# Patient Record
Sex: Male | Born: 1990 | Race: Black or African American | Hispanic: No | Marital: Single | Smoking: Never smoker
Health system: Southern US, Community
[De-identification: ages and names within clinical notes are randomized; demographics above are authoritative.]

## PROBLEM LIST (undated history)

## (undated) ENCOUNTER — Emergency Department (HOSPITAL_COMMUNITY): Admission: EM | Discharge status: Against Medical Advice | Payer: Self-pay

## (undated) DIAGNOSIS — N400 Enlarged prostate without lower urinary tract symptoms: Secondary | ICD-10-CM

---

## 2013-07-24 ENCOUNTER — Emergency Department (HOSPITAL_COMMUNITY): Admission: EM | Admit: 2013-07-24 | Discharge: 2013-07-24 | Discharge status: Against Medical Advice | Payer: Self-pay

## 2013-07-24 NOTE — ED Notes (Signed)
Have called pt x 3, no response in lobby

## 2013-08-14 ENCOUNTER — Emergency Department (HOSPITAL_COMMUNITY): Payer: BC Managed Care – PPO

## 2013-08-14 ENCOUNTER — Emergency Department (HOSPITAL_COMMUNITY)
Admission: EM | Admit: 2013-08-14 | Discharge: 2013-08-14 | Disposition: A | Discharge status: Home / Self Care | Payer: BC Managed Care – PPO | Attending: Emergency Medicine | Admitting: Emergency Medicine

## 2013-08-14 ENCOUNTER — Encounter (HOSPITAL_COMMUNITY): Payer: Self-pay | Admitting: Emergency Medicine

## 2013-08-14 DIAGNOSIS — S6990XA Unspecified injury of unspecified wrist, hand and finger(s), initial encounter: Principal | ICD-10-CM

## 2013-08-14 DIAGNOSIS — M25521 Pain in right elbow: Secondary | ICD-10-CM

## 2013-08-14 DIAGNOSIS — S59909A Unspecified injury of unspecified elbow, initial encounter: Secondary | ICD-10-CM | POA: Insufficient documentation

## 2013-08-14 DIAGNOSIS — S59919A Unspecified injury of unspecified forearm, initial encounter: Principal | ICD-10-CM

## 2013-08-14 DIAGNOSIS — Y939 Activity, unspecified: Secondary | ICD-10-CM | POA: Insufficient documentation

## 2013-08-14 MED ORDER — NAPROXEN 250 MG PO TABS
500.0000 mg | ORAL_TABLET | Freq: Once | ORAL | Status: AC
  Administered 2013-08-14: 500 mg via ORAL
  Filled 2013-08-14: qty 2

## 2013-08-14 MED ORDER — NAPROXEN 500 MG PO TABS: 500.0000 mg | ORAL_TABLET | Freq: Two times a day (BID) | ORAL | Status: DC
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
## 2013-08-14 NOTE — Discharge Instructions (Signed)
Musculoskeletal Pain Musculoskeletal pain is muscle and boney aches and pains. These pains can occur in any part of the body. Your caregiver may treat you without knowing the cause of the pain. They may treat you if blood or urine tests, X-rays, and other tests were normal.  CAUSES There is often not a definite cause or reason for these pains. These pains may be caused by a type of germ (virus). The discomfort may also come from overuse. Overuse includes working out too hard when your body is not fit. Boney aches also come from weather changes. Bone is sensitive to atmospheric pressure changes. HOME CARE INSTRUCTIONS   Ask when your test results will be ready. Make sure you get your test results.  Only take over-the-counter or prescription medicines for pain, discomfort, or fever as directed by your caregiver. If you were given medications for your condition, do not drive, operate machinery or power tools, or sign legal documents for 24 hours. Do not drink alcohol. Do not take sleeping pills or other medications that may interfere with treatment.  Continue all activities unless the activities cause more pain. When the pain lessens, slowly resume normal activities. Gradually increase the intensity and duration of the activities or exercise.  During periods of severe pain, bed rest may be helpful. Lay or sit in any position that is comfortable.  Putting ice on the injured area.  Put ice in a bag.  Place a towel between your skin and the bag.  Leave the ice on for 15 to 20 minutes, 3 to 4 times a day.  Follow up with your caregiver for continued problems and no reason can be found for the pain. If the pain becomes worse or does not go away, it may be necessary to repeat tests or do additional testing. Your caregiver may need to look further for a possible cause. SEEK IMMEDIATE MEDICAL CARE IF:  You have pain that is getting worse and is not relieved by medications.  You develop chest pain  that is associated with shortness or breath, sweating, feeling sick to your stomach (nauseous), or throw up (vomit).  Your pain becomes localized to the abdomen.  You develop any new symptoms that seem different or that concern you. MAKE SURE YOU:   Understand these instructions.  Will watch your condition.  Will get help right away if you are not doing well or get worse. Document Released: 04/05/2005 Document Revised: 06/28/2011 Document Reviewed: 12/08/2012 [facility] Patient Information 2014 [place], [place].  Motor Vehicle Collision After a car crash (motor vehicle collision), it is normal to have bruises and sore muscles. The first 24 hours usually feel the worst. After that, you will likely start to feel better each day. HOME CARE  Put ice on the injured area.  Put ice in a plastic bag.  Place a towel between your skin and the bag.  Leave the ice on for 15-20 minutes, 03-04 times a day.  Drink enough fluids to keep your pee (urine) clear or pale yellow.  Do not drink alcohol.  Take a warm shower or bath 1 or 2 times a day. This helps your sore muscles.  Return to activities as told by your doctor. Be careful when lifting. Lifting can make neck or back pain worse.  Only take medicine as told by your doctor. Do not use aspirin. GET HELP RIGHT AWAY IF:   Your arms or legs tingle, feel weak, or lose feeling (numbness).  You have headaches that do not  get better with medicine.  You have neck pain, especially in the middle of the back of your neck.  You cannot control when you pee (urinate) or poop (bowel movement).  Pain is getting worse in any part of your body.  You are short of breath, dizzy, or pass out (faint).  You have chest pain.  You feel sick to your stomach (nauseous), throw up (vomit), or sweat.  You have belly (abdominal) pain that gets worse.  There is blood in your pee, poop, or throw up.  You have pain in your shoulder (shoulder strap  areas).  Your problems are getting worse. MAKE SURE YOU:   Understand these instructions.  Will watch your condition.  Will get help right away if you are not doing well or get worse. Document Released: 09/22/2007 Document Revised: 06/28/2011 Document Reviewed: 09/02/2010 [facility] Patient Information 2014 [place], [place].
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
## 2013-08-14 NOTE — ED Notes (Signed)
Patient transported to X-ray 

## 2013-08-15 NOTE — ED Provider Notes (Signed)
Medical screening examination/treatment/procedure(s) were performed by non-physician practitioner and as supervising physician I was immediately available for consultation/collaboration.  Deana Krock T Celia Friedland, MD 08/15/13 2254 

## 2013-09-09 ENCOUNTER — Encounter (HOSPITAL_COMMUNITY): Payer: Self-pay | Admitting: Emergency Medicine

## 2013-09-09 ENCOUNTER — Emergency Department (HOSPITAL_COMMUNITY)
Admission: EM | Admit: 2013-09-09 | Discharge: 2013-09-09 | Disposition: A | Discharge status: Home / Self Care | Payer: BC Managed Care – PPO | Attending: Emergency Medicine | Admitting: Emergency Medicine

## 2013-09-09 DIAGNOSIS — M62830 Muscle spasm of back: Secondary | ICD-10-CM

## 2013-09-09 DIAGNOSIS — Z791 Long term (current) use of non-steroidal anti-inflammatories (NSAID): Secondary | ICD-10-CM | POA: Insufficient documentation

## 2013-09-09 DIAGNOSIS — M5380 Other specified dorsopathies, site unspecified: Secondary | ICD-10-CM | POA: Insufficient documentation

## 2013-09-09 DIAGNOSIS — Z87448 Personal history of other diseases of urinary system: Secondary | ICD-10-CM | POA: Insufficient documentation

## 2013-09-09 HISTORY — DX: Benign prostatic hyperplasia without lower urinary tract symptoms: N40.0

## 2013-09-09 LAB — URINALYSIS, ROUTINE W REFLEX MICROSCOPIC
BILIRUBIN URINE: NEGATIVE
GLUCOSE, UA: NEGATIVE mg/dL
HGB URINE DIPSTICK: NEGATIVE
KETONES UR: NEGATIVE mg/dL
Leukocytes, UA: NEGATIVE
Nitrite: NEGATIVE
PROTEIN: NEGATIVE mg/dL
Specific Gravity, Urine: 1.029 (ref 1.005–1.030)
UROBILINOGEN UA: 1 mg/dL (ref 0.0–1.0)
pH: 6 (ref 5.0–8.0)

## 2013-09-09 MED ORDER — HYDROCODONE-ACETAMINOPHEN 5-325 MG PO TABS: 1.0000 | ORAL_TABLET | Freq: Four times a day (QID) | ORAL | Status: DC | PRN

## 2013-09-09 MED ORDER — OXYCODONE-ACETAMINOPHEN 5-325 MG PO TABS
2.0000 | ORAL_TABLET | Freq: Once | ORAL | Status: AC
  Administered 2013-09-09: 2 via ORAL
  Filled 2013-09-09: qty 2

## 2013-09-09 MED ORDER — ONDANSETRON 4 MG PO TBDP
4.0000 mg | ORAL_TABLET | Freq: Once | ORAL | Status: AC
  Administered 2013-09-09: 4 mg via ORAL
  Filled 2013-09-09: qty 1

## 2013-09-09 NOTE — ED Notes (Signed)
Reports pain to L side of back that started after using bathroom at work.  No known injury.  Denies pain with urination.  States pain is worse with movement.

## 2013-09-09 NOTE — ED Provider Notes (Signed)
CSN: [id number]     Arrival date & time 09/09/13  2028 History   First MD Initiated Contact with Patient 09/09/13 2043    This chart was scribed for non-physician practitioner, Arthor Captain, working with Rolan Bucco, MD by Marica Otter, ED Scribe. This patient was seen in room TR04C/TR04C and the patient's care was started at  10:22 PM.  Chief Complaint  Patient presents with  . Back Pain   The history is provided by the patient. No language interpreter was used.   HPI Comments: Marcus Jefferson is a 23 y.o. male, with a history of prostate enlargement, who presents to the Emergency Department complaining of left sided back pain onset couple of days ago after pt used the bathroom at work where he was "straining" to have a bowel movement. Thereafter, pt reports that he tried to stretch his back and had a muscle spasm. Pt reports the pain has alleviated since its onset and reports that initially his back hurt with inhalation. Pt reports, however, that his pain continues to be worse with movement. Pt denies any new injuries to the area. Pt reports that he has back pain at baseline. Pt denies radiating pain, loss of bowel or bladder function, abd pain. Pt reports he has to lift boxes as part of his job.   Past Medical History  Diagnosis Date  . Prostate enlargement    History reviewed. No pertinent past surgical history. No family history on file. History  Substance Use Topics  . Smoking status: Never Smoker   . Smokeless tobacco: Not on file  . Alcohol Use: No    Review of Systems  Constitutional: Negative for fever.  Musculoskeletal: Positive for back pain (left sided back pain ).   Allergies  Review of patient's allergies indicates no known allergies.  Home Medications   Prior to Admission medications   Medication Sig Start Date End Date Taking? Authorizing Provider  naproxen (NAPROSYN) 500 MG tablet Take 1 tablet (500 mg total) by mouth 2 (two) times daily with a meal. 08/14/13    Mora Bellman, PA-C   Triage Vitals: BP 132/71  Pulse 64  Temp(Src) 98.3 F (36.8 C) (Oral)  Resp 16  Ht 6\' 1"  (1.854 m)  Wt 201 lb 9.6 oz (91.445 kg)  BMI 26.60 kg/m2  SpO2 95% Physical Exam  Nursing note and vitals reviewed. Constitutional: He is oriented to person, place, and time. He appears well-developed and well-nourished. No distress.  HENT:  Head: Normocephalic and atraumatic.  Eyes: EOM are normal.  Neck: Neck supple. No tracheal deviation present.  Cardiovascular: Normal rate.   Pulmonary/Chest: Effort normal. No respiratory distress.  Musculoskeletal: Normal range of motion. He exhibits tenderness (clear spasm of lower lumbar para spinal muscle. Hurts to twist to the left, extension and left lateral flexion. ).  Neurological: He is alert and oriented to person, place, and time.  Skin: Skin is warm and dry.  Psychiatric: He has a normal mood and affect. His behavior is normal.    ED Course  Procedures (including critical care time) DIAGNOSTIC STUDIES: Oxygen Saturation is 95% on RA, adequate by my interpretation.    COORDINATION OF CARE: 10:30 PM-Discussed treatment plan which includes meds, gentle stretching, massage therapy, and urology referral with pt at bedside and pt agreed to plan.   Labs Review Labs Reviewed  URINALYSIS, ROUTINE W REFLEX MICROSCOPIC    Imaging Review No results found.   EKG Interpretation None      MDM  Final diagnoses:  Spasm of back muscles    Patient with back pain.  No neurological deficits and normal neuro exam.  Patient can walk but states is painful.  No loss of bowel or bladder control.  No concern for cauda equina.  No fever, night sweats, weight loss, h/o cancer, IVDU.  RICE protocol and pain medicine indicated and discussed with patient.    I personally performed the services described in this documentation, which was scribed in my presence. The recorded information has been reviewed and is accurate.       Arthor CaptainAbigail Ludivina Guymon, PA-C 09/10/13 2053

## 2013-09-09 NOTE — Discharge Instructions (Signed)
Musculoskeletal Pain Musculoskeletal pain is muscle and boney aches and pains. These pains can occur in any part of the body. Your caregiver may treat you without knowing the cause of the pain. They may treat you if blood or urine tests, X-rays, and other tests were normal.  CAUSES There is often not a definite cause or reason for these pains. These pains may be caused by a type of germ (virus). The discomfort may also come from overuse. Overuse includes working out too hard when your body is not fit. Boney aches also come from weather changes. Bone is sensitive to atmospheric pressure changes. HOME CARE INSTRUCTIONS   Ask when your test results will be ready. Make sure you get your test results.  Only take over-the-counter or prescription medicines for pain, discomfort, or fever as directed by your caregiver. If you were given medications for your condition, do not drive, operate machinery or power tools, or sign legal documents for 24 hours. Do not drink alcohol. Do not take sleeping pills or other medications that may interfere with treatment.  Continue all activities unless the activities cause more pain. When the pain lessens, slowly resume normal activities. Gradually increase the intensity and duration of the activities or exercise.  During periods of severe pain, bed rest may be helpful. Lay or sit in any position that is comfortable.  Putting ice on the injured area.  Put ice in a bag.  Place a towel between your skin and the bag.  Leave the ice on for 15 to 20 minutes, 3 to 4 times a day.  Follow up with your caregiver for continued problems and no reason can be found for the pain. If the pain becomes worse or does not go away, it may be necessary to repeat tests or do additional testing. Your caregiver may need to look further for a possible cause. SEEK IMMEDIATE MEDICAL CARE IF:  You have pain that is getting worse and is not relieved by medications.  You develop chest pain  that is associated with shortness or breath, sweating, feeling sick to your stomach (nauseous), or throw up (vomit).  Your pain becomes localized to the abdomen.  You develop any new symptoms that seem different or that concern you. MAKE SURE YOU:   Understand these instructions.  Will watch your condition.  Will get help right away if you are not doing well or get worse. Document Released: 04/05/2005 Document Revised: 06/28/2011 Document Reviewed: 12/08/2012 [facility] Patient Information 2014 [place], [place].  Spasticity Spasticity is a condition in which certain muscles contract continuously. This causes stiffness or tightness of the muscles. It may interfere with movement, speech, and manner of walking. CAUSES  This condition is usually caused by damage to the portion of the brain or spinal cord that controls voluntary movement. It may occur in association with:  Spinal cord injury.  Multiple sclerosis.  Cerebral palsy.  Brain damage due to lack of oxygen.  Brain trauma.  Severe head injury.  Metabolic diseases such as:  Adrenoleukodystrophy.  ALS Truddie Hidden(Lou Gehrig's disease).  Phenylketonuria. SYMPTOMS   Increased muscle tone (hypertonicity).  A series of rapid muscle contractions (clonus).  Exaggerated deep tendon reflexes.  Muscle spasms.  Involuntary crossing of the legs (scissoring).  Fixed joints. The degree of spasticity varies. It ranges from mild muscle stiffness to severe, painful, and uncontrollable muscle spasms. It can interfere with rehabilitation in patients with certain disorders. It often interferes with daily activities. TREATMENT  Treatment may include:  Medications.  Physical therapy regimens. They may include muscle stretching and range of motion exercises. These help prevent shrinkage or shortening of muscles. They also help reduce the severity of symptoms.  Surgery. This may be recommended for tendon release or to sever the nerve-muscle  pathway. PROGNOSIS  The outcome for those with spasticity depends on:  Severity of the spasticity.  Associated disorder(s). Document Released: 03/26/2002 Document Revised: 06/28/2011 Document Reviewed: 04/05/2005 [facility] Patient Information 2014 [place], [place].

## 2013-09-12 ENCOUNTER — Emergency Department (HOSPITAL_COMMUNITY)
Admission: EM | Admit: 2013-09-12 | Discharge: 2013-09-12 | Disposition: A | Discharge status: Home / Self Care | Payer: BC Managed Care – PPO | Attending: Emergency Medicine | Admitting: Emergency Medicine

## 2013-09-12 ENCOUNTER — Encounter (HOSPITAL_COMMUNITY): Payer: Self-pay | Admitting: Emergency Medicine

## 2013-09-12 DIAGNOSIS — M549 Dorsalgia, unspecified: Secondary | ICD-10-CM

## 2013-09-12 DIAGNOSIS — Z791 Long term (current) use of non-steroidal anti-inflammatories (NSAID): Secondary | ICD-10-CM | POA: Insufficient documentation

## 2013-09-12 DIAGNOSIS — M545 Low back pain, unspecified: Secondary | ICD-10-CM | POA: Insufficient documentation

## 2013-09-12 DIAGNOSIS — Z87448 Personal history of other diseases of urinary system: Secondary | ICD-10-CM | POA: Insufficient documentation

## 2013-09-12 DIAGNOSIS — K5900 Constipation, unspecified: Secondary | ICD-10-CM | POA: Insufficient documentation

## 2013-09-12 MED ORDER — PREDNISONE 20 MG PO TABS
40.0000 mg | ORAL_TABLET | Freq: Once | ORAL | Status: AC
  Administered 2013-09-12: 40 mg via ORAL
  Filled 2013-09-12: qty 2

## 2013-09-12 MED ORDER — KETOROLAC TROMETHAMINE 30 MG/ML IJ SOLN
30.0000 mg | Freq: Once | INTRAMUSCULAR | Status: DC
  Filled 2013-09-12: qty 1

## 2013-09-12 MED ORDER — PREDNISONE 20 MG PO TABS: 40.0000 mg | ORAL_TABLET | Freq: Every day | ORAL | Status: DC

## 2013-09-12 MED ORDER — METHOCARBAMOL 500 MG PO TABS: 1000.0000 mg | ORAL_TABLET | Freq: Four times a day (QID) | ORAL | Status: DC | PRN

## 2013-09-12 MED ORDER — METHOCARBAMOL 500 MG PO TABS
1000.0000 mg | ORAL_TABLET | Freq: Once | ORAL | Status: AC
  Administered 2013-09-12: 1000 mg via ORAL
  Filled 2013-09-12: qty 2

## 2013-09-12 NOTE — Discharge Instructions (Signed)
For pain control you may take up to 800mg  of Motrin (also known as ibuprofen). That is usually 4 over the counter pills,  3 times a day. Take with food to minimize stomach irritation   You can also take  tylenol (acetaminophen) 975mg  (this is 3 over the counter pills) four times a day. Do not drink alcohol or combine with other medications that have acetaminophen as an ingredient (Read the labels!).    For breakthrough pain you may take Robaxin. Do not drink alcohol, drive or operate heavy machinery when taking Robaxin.  Do not hesitate to return to the Emergency Department for any new, worsening or concerning symptoms.   If you do not have a primary care doctor you can establish one at the   [facility]: [address] [place] [address] [phone]  After you establish care. Let them know you were seen in the emergency room. They must obtain records for further management.    Back Pain, Adult Back pain is very common. The pain often gets better over time. The cause of back pain is usually not dangerous. Most people can learn to manage their back pain on their own.  HOME CARE   Stay active. Start with short walks on flat ground if you can. Try to walk farther each day.  Do not sit, drive, or stand in one place for more than 30 minutes. Do not stay in bed.  Do not avoid exercise or work. Activity can help your back heal faster.  Be careful when you bend or lift an object. Bend at your knees, keep the object close to you, and do not twist.  Sleep on a firm mattress. Lie on your side, and bend your knees. If you lie on your back, put a pillow under your knees.  Only take medicines as told by your doctor.  Put ice on the injured area.  Put ice in a plastic bag.  Place a towel between your skin and the bag.  Leave the ice on for 15-20 minutes, 03-04 times a day for the first 2 to 3 days. After that, you can switch between ice and heat packs.  Ask your doctor  about back exercises or massage.  Avoid feeling anxious or stressed. Find good ways to deal with stress, such as exercise. GET HELP RIGHT AWAY IF:   Your pain does not go away with rest or medicine.  Your pain does not go away in 1 week.  You have new problems.  You do not feel well.  The pain spreads into your legs.  You cannot control when you poop (bowel movement) or pee (urinate).  Your arms or legs feel weak or lose feeling (numbness).  You feel sick to your stomach (nauseous) or throw up (vomit).  You have belly (abdominal) pain.  You feel like you may pass out (faint). MAKE SURE YOU:   Understand these instructions.  Will watch your condition.  Will get help right away if you are not doing well or get worse. Document Released: 09/22/2007 Document Revised: 06/28/2011 Document Reviewed: 08/24/2010 [facility] Patient Information 2014 [place], [place].

## 2013-09-12 NOTE — ED Notes (Addendum)
Pt c/o left lower back pain that started while he was at work after he picked up a heavy box, was sent over here and told he pulled a muscle. Pt sts the pain hasn't gone away and the pain increases with movement. sts he has been taking medicine at home and trying to stretch some at home. Denies urinary/bowel incontinence. Nad, skin warm and dry, resp e/u.

## 2013-09-12 NOTE — ED Provider Notes (Signed)
CSN: [id number]     Arrival date & time 09/12/13  1901 History  This chart was scribed for non-physician practitioner Wynetta Emery, PA-C working with Layla Maw Ward, DO by Danella Maiers, ED Scribe. This patient was seen in room TR08C/TR08C and the patient's care was started at 7:16 PM.    Chief Complaint  Patient presents with  . Back Pain   The history is provided by the patient. No language interpreter was used.   HPI Comments: Marcus Jefferson is a 23 y.o. male who presents to the Emergency Department complaining of left lower back pain onset 5 days ago while lifting a heavy box at work. He was seen here 3 days ago for the same and was given percocet with no relief. He reports recent constipation.  He states he has been told he has an enlarged prostate. He has an appointment with urology. He had a normal colonoscopy and endoscopy after hematuria and blood in stool, which has resolved.  Denies fever, chills, change in  bladder habits, h/o IDVU or cancer, numbness or weakness.   Past Medical History  Diagnosis Date  . Prostate enlargement    History reviewed. No pertinent past surgical history. No family history on file. History  Substance Use Topics  . Smoking status: Never Smoker   . Smokeless tobacco: Not on file  . Alcohol Use: No    Review of Systems  Constitutional: Negative for fever.  Musculoskeletal: Positive for back pain.  Neurological: Negative for numbness.  All other systems reviewed and are negative.     Allergies  Review of patient's allergies indicates no known allergies.  Home Medications   Prior to Admission medications   Medication Sig Start Date End Date Taking? Authorizing Provider  HYDROcodone-acetaminophen (NORCO) 5-325 MG per tablet Take 1-2 tablets by mouth every 6 (six) hours as needed for moderate pain. 09/09/13   Arthor Captain, PA-C  naproxen (NAPROSYN) 500 MG tablet Take 1 tablet (500 mg total) by mouth 2 (two) times daily with a meal. 08/14/13    Mora Bellman, PA-C   BP 127/65  Pulse 74  Temp(Src) 98.2 F (36.8 C) (Oral)  Resp 16  Ht 6' (1.829 m)  Wt 196 lb 12.8 oz (89.268 kg)  BMI 26.69 kg/m2  SpO2 98% Physical Exam  Nursing note and vitals reviewed. Constitutional: He is oriented to person, place, and time. He appears well-developed and well-nourished. No distress.  HENT:  Head: Normocephalic.  Eyes: Conjunctivae and EOM are normal.  Cardiovascular: Normal rate.   Pulmonary/Chest: Effort normal and breath sounds normal. No stridor.  Abdominal: Soft.  Musculoskeletal: Normal range of motion.  No point tenderness to percussion of lumbar spinal processes.  Patient with left lumbar paraspinal spasm and tenderness to palpation Strength is 5 out of 5 to bilateral lower extremities at hip and knee; extensor hallucis longus 5 out of 5. Ankle strength 5 out of 5, no clonus, neurovascularly intact. No saddle anaesthesia. Patellar reflexes are 2+ bilaterally.     Strait leg raise negative bilaterally Ambulates with nonantalgic gait   Neurological: He is alert and oriented to person, place, and time.  Psychiatric: He has a normal mood and affect.    ED Course  Procedures (including critical care time) Medications  predniSONE (DELTASONE) tablet 40 mg (40 mg Oral Given 09/12/13 2102)  methocarbamol (ROBAXIN) tablet 1,000 mg (1,000 mg Oral Given 09/12/13 2102)    DIAGNOSTIC STUDIES: Oxygen Saturation is 98% on RA, normal by my interpretation.  COORDINATION OF CARE: 8:16 PM- Discussed treatment plan with pt which includes Toradol injection and discharge home with prednisone and robaxin. Will refer to ortho. Pt agrees to plan.    Labs Review Labs Reviewed - No data to display  Imaging Review No results found.   EKG Interpretation None      MDM   Final diagnoses:  Back pain    Filed Vitals:   09/12/13 1909 09/12/13 2100  BP: 127/65 115/60  Pulse: 74 57  Temp: 98.2 F (36.8 C) 98 F (36.7 C)  TempSrc:  Oral Oral  Resp: 16 20  Height: 6' (1.829 m)   Weight: 196 lb 12.8 oz (89.268 kg)   SpO2: 98% 99%    Medications  predniSONE (DELTASONE) tablet 40 mg (40 mg Oral Given 09/12/13 2102)  methocarbamol (ROBAXIN) tablet 1,000 mg (1,000 mg Oral Given 09/12/13 2102)    Marcus Jefferson is a 23 y.o. male presenting with  back pain.  Constipation likely secondary to Percocet use. No neurological deficits and normal neuro exam.  Patient can walk but states is painful.  No loss of bowel or bladder control.  No concern for cauda equina.  No fever, night sweats, weight loss, h/o cancer, IVDU.  RICE protocol and pain medicine indicated and discussed with patient.   Evaluation does not show pathology that would require ongoing emergent intervention or inpatient treatment. Pt is hemodynamically stable and mentating appropriately. Discussed findings and plan with patient/guardian, who agrees with care plan. All questions answered. Return precautions discussed and outpatient follow up given.   Discharge Medication List as of 09/12/2013  8:25 PM    START taking these medications   Details  methocarbamol (ROBAXIN) 500 MG tablet Take 2 tablets (1,000 mg total) by mouth 4 (four) times daily as needed (Pain)., Starting 09/12/2013, Until Discontinued, Print    predniSONE (DELTASONE) 20 MG tablet Take 2 tablets (40 mg total) by mouth daily., Starting 09/12/2013, Until Discontinued, Print        Note: Portions of this report may have been transcribed using voice recognition software. Every effort was made to ensure accuracy; however, inadvertent computerized transcription errors may be present   I personally performed the services described in this documentation, which was scribed in my presence. The recorded information has been reviewed and is accurate.   Joni Reiningicole Burney Calzadilla, PA-C 09/13/13 [phone]

## 2013-09-13 NOTE — ED Provider Notes (Signed)
Medical screening examination/treatment/procedure(s) were performed by non-physician practitioner and as supervising physician I was immediately available for consultation/collaboration.   EKG Interpretation None        Jaiyanna Safran, MD 09/13/13 1055 

## 2013-09-15 NOTE — ED Provider Notes (Signed)
Medical screening examination/treatment/procedure(s) were performed by non-physician practitioner and as supervising physician I was immediately available for consultation/collaboration.   EKG Interpretation None        Elky Funches N Jaedin Trumbo, DO 09/15/13 0707 

## 2013-12-25 ENCOUNTER — Emergency Department (HOSPITAL_COMMUNITY)
Admission: EM | Admit: 2013-12-25 | Discharge: 2013-12-25 | Discharge status: Against Medical Advice | Payer: No Typology Code available for payment source | Attending: Emergency Medicine | Admitting: Emergency Medicine

## 2013-12-25 ENCOUNTER — Encounter (HOSPITAL_COMMUNITY): Payer: Self-pay | Admitting: Emergency Medicine

## 2013-12-25 DIAGNOSIS — N509 Disorder of male genital organs, unspecified: Secondary | ICD-10-CM | POA: Insufficient documentation

## 2013-12-25 NOTE — ED Notes (Signed)
Pt reports testicle pain and rectal pain x "years". States he has been seen for same multiple times and this pain is the same. Denies swelling to testicle. 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 AO x4.

## 2013-12-25 NOTE — ED Notes (Addendum)
I spoke to Marcus Jefferson and he said he had to go get his daughter and son.  He left without being seen after triage.

## 2014-02-11 ENCOUNTER — Emergency Department (HOSPITAL_COMMUNITY): Payer: BC Managed Care – PPO

## 2014-02-11 ENCOUNTER — Emergency Department (HOSPITAL_COMMUNITY)
Admission: EM | Admit: 2014-02-11 | Discharge: 2014-02-11 | Disposition: A | Discharge status: Home / Self Care | Payer: BC Managed Care – PPO | Attending: Emergency Medicine | Admitting: Emergency Medicine

## 2014-02-11 ENCOUNTER — Encounter (HOSPITAL_COMMUNITY): Payer: Self-pay | Admitting: Emergency Medicine

## 2014-02-11 DIAGNOSIS — Z87448 Personal history of other diseases of urinary system: Secondary | ICD-10-CM | POA: Insufficient documentation

## 2014-02-11 DIAGNOSIS — N508 Other specified disorders of male genital organs: Secondary | ICD-10-CM | POA: Insufficient documentation

## 2014-02-11 DIAGNOSIS — Z791 Long term (current) use of non-steroidal anti-inflammatories (NSAID): Secondary | ICD-10-CM | POA: Insufficient documentation

## 2014-02-11 DIAGNOSIS — N509 Disorder of male genital organs, unspecified: Secondary | ICD-10-CM

## 2014-02-11 DIAGNOSIS — R52 Pain, unspecified: Secondary | ICD-10-CM

## 2014-02-11 DIAGNOSIS — N50819 Testicular pain, unspecified: Secondary | ICD-10-CM

## 2014-02-11 LAB — URINALYSIS, ROUTINE W REFLEX MICROSCOPIC
Bilirubin Urine: NEGATIVE
GLUCOSE, UA: NEGATIVE mg/dL
HGB URINE DIPSTICK: NEGATIVE
KETONES UR: NEGATIVE mg/dL
LEUKOCYTES UA: NEGATIVE
Nitrite: NEGATIVE
PH: 5 (ref 5.0–8.0)
PROTEIN: NEGATIVE mg/dL
Specific Gravity, Urine: 1.03 (ref 1.005–1.030)
Urobilinogen, UA: 0.2 mg/dL (ref 0.0–1.0)

## 2014-02-11 MED ORDER — HYDROCODONE-ACETAMINOPHEN 5-325 MG PO TABS
2.0000 | ORAL_TABLET | Freq: Once | ORAL | Status: AC
  Administered 2014-02-11: 2 via ORAL
  Filled 2014-02-11: qty 2

## 2014-02-11 NOTE — ED Notes (Signed)
Pt reports having testicular pain; has been going on for years and will have flare ups; was told he has a cyst; wants to know why its flaring up

## 2014-02-11 NOTE — ED Provider Notes (Signed)
CSN: [id number]     Arrival date & time 02/11/14  1046 History   First MD Initiated Contact with Patient 02/11/14 1116     Chief Complaint  Patient presents with  . Testicle Pain     (Consider location/radiation/quality/duration/timing/severity/associated sxs/prior Treatment) HPI Marcus Jefferson is a 23 y.o. male with a history of reported prostate enlargement comes in for evaluation of bilateral testicular pain. Patient states the testicular pain has been constant for the past 3 or 4 years. [place] in [place]  and is getting mixed reports of enlarged prostate and bladder versus not. Reports having had hematuria and hematochezia in the past with subsequent cystoscopy as well as colonoscopy with normal findings. Reports occasional hesitancy. No current dysuria, hematuria, no sexual dysfunction but does report blood in his semen 2 months ago that has since resolved. Reports taking hot baths relieves the pain, but nothing aggravates the pain. Patient reports he is only here to find out if there is new structural change or cysts in his scrotum causing the pain. No fevers, shortness of breath, chest pain, abdominal pain, dysuria.   Past Medical History  Diagnosis Date  . Prostate enlargement    History reviewed. No pertinent past surgical history. History reviewed. No pertinent family history. History  Substance Use Topics  . Smoking status: Never Smoker   . Smokeless tobacco: Not on file  . Alcohol Use: Yes     Comment: occasionally    Review of Systems  Constitutional: Negative for fever.  HENT: Negative for sore throat.   Eyes: Negative for visual disturbance.  Respiratory: Negative for shortness of breath.   Cardiovascular: Negative for chest pain.  Gastrointestinal: Negative for abdominal pain.  Endocrine: Negative for polyuria.  Genitourinary: Positive for testicular pain. Negative for dysuria.  Skin: Negative for rash.  Neurological:  Negative for headaches.      Allergies  Review of patient's allergies indicates no known allergies.  Home Medications   Prior to Admission medications   Medication Sig Start Date End Date Taking? Authorizing Provider  Menthol-Methyl Salicylate (MUSCLE RUB) 10-15 % CREA Apply 1 application topically as needed for muscle pain.   Yes Historical Provider, MD  Multiple Vitamins-Minerals (MULTIVITAMIN PO) Take 1 tablet by mouth daily.   Yes Historical Provider, MD  naproxen sodium (ANAPROX) 220 MG tablet Take 440 mg by mouth 2 (two) times daily as needed (for pain).   Yes Historical Provider, MD   BP 129/64  Pulse 62  Temp(Src) 98.1 F (36.7 C) (Oral)  Resp 18  Ht 6' (1.829 m)  Wt 205 lb (92.987 kg)  BMI 27.80 kg/m2  SpO2 100% Physical Exam  Nursing note and vitals reviewed. Constitutional: He is oriented to person, place, and time. He appears well-developed and well-nourished.  HENT:  Head: Normocephalic and atraumatic.  Mouth/Throat: Oropharynx is clear and moist.  Eyes: Conjunctivae are normal. Pupils are equal, round, and reactive to light. Right eye exhibits no discharge. Left eye exhibits no discharge. No scleral icterus.  Neck: Neck supple.  Cardiovascular: Normal rate, regular rhythm and normal heart sounds.   Pulmonary/Chest: Effort normal and breath sounds normal. No respiratory distress. He has no wheezes. He has no rales.  Abdominal: Soft. There is no tenderness.  Genitourinary: Penis normal. No penile tenderness.  Testicles appear to have equal and normal lie. No erythema, rashes or other lesions. No evidence of infection. No overt masses, cysts or other deformities appreciated  Musculoskeletal: He exhibits no  tenderness.  Neurological: He is alert and oriented to person, place, and time.  Cranial Nerves II-XII grossly intact  Skin: Skin is warm and dry. No rash noted.  Psychiatric: He has a normal mood and affect.    ED Course  Procedures (including critical care  time) Labs Review Labs Reviewed  URINALYSIS, ROUTINE W REFLEX MICROSCOPIC - Abnormal; Notable for the following:    Color, Urine ORANGE (*)    All other components within normal limits    Imaging Review [place] Scrotum  02/11/2014   CLINICAL DATA:  Testicular pain.  EXAM: SCROTAL ULTRASOUND  DOPPLER ULTRASOUND OF THE TESTICLES  TECHNIQUE: Standard color-flow duplex Doppler exam of the testicles performed.  FINDINGS: Right testicle  Measurements: .  No mass or microlithiasis visualized.  Scratched  Measurements: 5.2 x 2.0 x 3.1 cm. No mass or microlithiasis visualized.  Left testicle  Measurements: 4.5 x 2.0 x 3.4 cm. No mass or microlithiasis visualized.  Right epididymis:  Normal in size and appearance.  Left epididymis:  3 mm epididymal cysts.  Hydrocele:  None visualized.  Varicocele:  Small left varicocele.  Pulsed Doppler interrogation of both testes demonstrates low resistance arterial and venous waveforms bilaterally.  IMPRESSION: 1. No evidence of testicular torsion. 2. Small right varicocele.  Complete ultrasound examination of the testicles, epididymis, and other scrotal structures was performed. Color and spectral Doppler ultrasound were also utilized to evaluate blood flow to the testicles.  COMPARISON:  None.   Electronically Signed   By: Maisie Fushomas  Register   On: 02/11/2014 13:33   [place] Art/ven Flow Abd Pelv Doppler  02/11/2014   CLINICAL DATA:  Testicular pain.  EXAM: SCROTAL ULTRASOUND  DOPPLER ULTRASOUND OF THE TESTICLES  TECHNIQUE: Standard color-flow duplex Doppler exam of the testicles performed.  FINDINGS: Right testicle  Measurements: .  No mass or microlithiasis visualized.  Scratched  Measurements: 5.2 x 2.0 x 3.1 cm. No mass or microlithiasis visualized.  Left testicle  Measurements: 4.5 x 2.0 x 3.4 cm. No mass or microlithiasis visualized.  Right epididymis:  Normal in size and appearance.  Left epididymis:  3 mm epididymal cysts.  Hydrocele:  None visualized.  Varicocele:  Small left  varicocele.  Pulsed Doppler interrogation of both testes demonstrates low resistance arterial and venous waveforms bilaterally.  IMPRESSION: 1. No evidence of testicular torsion. 2. Small right varicocele.  Complete ultrasound examination of the testicles, epididymis, and other scrotal structures was performed. Color and spectral Doppler ultrasound were also utilized to evaluate blood flow to the testicles.  COMPARISON:  None.   Electronically Signed   By: Maisie Fushomas  Register   On: 02/11/2014 13:33     EKG Interpretation None      MDM  Vitals stable - WNL -afebrile Pt resting comfortably in ED. PE not concerning for any other acute or emergent pathology.  Imaging--US Scrotum shows small right varicocele without any evidence of testicular torsion. Also a left 3 mm epididymal cyst. Discussed results with pt at bedside. May fu with Urology--referral given Urinalysis noncontributory  Discussed f/u with PCP and return precautions, pt very amenable to plan. Stable, good condition and is appropriate for discharge   Prior to patient discharge, I discussed and reviewed this case with Dr.Linker    Final diagnoses:  Pain  Testicular pain       Sharlene MottsBenjamin W Keshan Reha, PA-C 02/12/14 [phone]

## 2014-02-11 NOTE — ED Notes (Signed)
Pt returned from US

## 2014-02-11 NOTE — Discharge Instructions (Signed)
Your evaluation in the ED today showed no emergent source for your testicular pain. You may follow-up with urology for further evaluation and management of your testicular pain. Return to ED if he experiences fevers, swelling or redness, difficulties urinating or having bowel movements.

## 2014-02-13 NOTE — ED Provider Notes (Signed)
Medical screening examination/treatment/procedure(s) were performed by non-physician practitioner and as supervising physician I was immediately available for consultation/collaboration.   EKG Interpretation None       Ethelda ChickMartha K Linker, MD 02/13/14 [phone]

## 2014-04-12 ENCOUNTER — Emergency Department (HOSPITAL_COMMUNITY)
Admission: EM | Admit: 2014-04-12 | Discharge: 2014-04-12 | Disposition: A | Discharge status: Home / Self Care | Payer: PRIVATE HEALTH INSURANCE | Attending: Emergency Medicine | Admitting: Emergency Medicine

## 2014-04-12 ENCOUNTER — Encounter (HOSPITAL_COMMUNITY): Payer: Self-pay | Admitting: *Deleted

## 2014-04-12 DIAGNOSIS — Z79899 Other long term (current) drug therapy: Secondary | ICD-10-CM | POA: Insufficient documentation

## 2014-04-12 DIAGNOSIS — Z87448 Personal history of other diseases of urinary system: Secondary | ICD-10-CM | POA: Diagnosis not present

## 2014-04-12 DIAGNOSIS — M545 Low back pain, unspecified: Secondary | ICD-10-CM

## 2014-04-12 MED ORDER — IBUPROFEN 800 MG PO TABS
800.0000 mg | ORAL_TABLET | Freq: Once | ORAL | Status: AC
  Administered 2014-04-12: 800 mg via ORAL
  Filled 2014-04-12: qty 1

## 2014-04-12 MED ORDER — CYCLOBENZAPRINE HCL 10 MG PO TABS
5.0000 mg | ORAL_TABLET | Freq: Once | ORAL | Status: AC
  Administered 2014-04-12: 5 mg via ORAL
  Filled 2014-04-12: qty 1

## 2014-04-12 MED ORDER — CYCLOBENZAPRINE HCL 10 MG PO TABS: 5.0000 mg | ORAL_TABLET | Freq: Three times a day (TID) | ORAL | Status: DC | PRN

## 2014-04-12 MED ORDER — IBUPROFEN 800 MG PO TABS: 800.0000 mg | ORAL_TABLET | Freq: Three times a day (TID) | ORAL | Status: DC | PRN

## 2014-04-12 NOTE — Discharge Instructions (Signed)
Back Exercises Take the medication as needed for pain and spasm. Call any of the numbers on the resource guide to get a primary care physician. Back exercises help treat and prevent back injuries. The goal of back exercises is to increase the strength of your abdominal and back muscles and the flexibility of your back. These exercises should be started when you no longer have back pain. Back exercises include:  Pelvic Tilt. Lie on your back with your knees bent. Tilt your pelvis until the lower part of your back is against the floor. Hold this position 5 to 10 sec and repeat 5 to 10 times.  Knee to Chest. Pull first 1 knee up against your chest and hold for 20 to 30 seconds, repeat this with the other knee, and then both knees. This may be done with the other leg straight or bent, whichever feels better.  Sit-Ups or Curl-Ups. Bend your knees 90 degrees. Start with tilting your pelvis, and do a partial, slow sit-up, lifting your trunk only 30 to 45 degrees off the floor. Take at least 2 to 3 seconds for each sit-up. Do not do sit-ups with your knees out straight. If partial sit-ups are difficult, simply do the above but with only tightening your abdominal muscles and holding it as directed.  Hip-Lift. Lie on your back with your knees flexed 90 degrees. Push down with your feet and shoulders as you raise your hips a couple inches off the floor; hold for 10 seconds, repeat 5 to 10 times.  Back arches. Lie on your stomach, propping yourself up on bent elbows. Slowly press on your hands, causing an arch in your low back. Repeat 3 to 5 times. Any initial stiffness and discomfort should lessen with repetition over time.  Shoulder-Lifts. Lie face down with arms beside your body. Keep hips and torso pressed to floor as you slowly lift your head and shoulders off the floor. Do not overdo your exercises, especially in the beginning. Exercises may cause you some mild back discomfort which lasts for a few minutes;  however, if the pain is more severe, or lasts for more than 15 minutes, do not continue exercises until you see your caregiver. Improvement with exercise therapy for back problems is slow.  See your caregivers for assistance with developing a proper back exercise program. Document Released: 05/13/2004 Document Revised: 06/28/2011 Document Reviewed: 02/04/2011 [facility] Patient Information 2015 [place], [place]. This information is not intended to replace advice given to you by your health care provider. Make sure you discuss any questions you have with your health care provider.  Emergency Department Resource Guide 1) Find a Doctor and Pay Out of Pocket Although you won't have to find out who is covered by your insurance plan, it is a good idea to ask around and get recommendations. You will then need to call the office and see if the doctor you have chosen will accept you as a new patient and what types of options they offer for patients who are self-pay. Some doctors offer discounts or will set up payment plans for their patients who do not have insurance, but you will need to ask so you aren't surprised when you get to your appointment.  2) Contact Your Local Health Department Not all health departments have doctors that can see patients for sick visits, but many do, so it is worth a call to see if yours does. If you don't know where your local health department is, you can check in  your phone book. The CDC also has a tool to help you locate your state's health department, and many state websites also have listings of all of their local health departments.  3) Find a Walk-in Clinic If your illness is not likely to be very severe or complicated, you may want to try a walk in clinic. These are popping up all over the country in pharmacies, drugstores, and shopping centers. They're usually staffed by nurse practitioners or physician assistants that have been trained to treat common illnesses and  complaints. They're usually fairly quick and inexpensive. However, if you have serious medical issues or chronic medical problems, these are probably not your best option.  No Primary Care Doctor: - Call Health Connect at  [phone] - they can help you locate a primary care doctor that  accepts your insurance, provides certain services, etc. - Physician Referral Service- [phone]  Chronic Pain Problems: Organization         Address  Phone   Notes  [facility]  [phone]) [phone] Patients need to be referred by their primary care doctor.   Medication Assistance: Organization         Address  Phone   Notes  [facility] Medication [facility] [address] [place], [place] [address] [phone] --Must be a resident of [facility] -- Must have NO insurance coverage whatsoever (no Medicaid/ Medicare, etc.) -- The pt. MUST have a primary care doctor that directs their care regularly and follows them in the community   MedAssist  [phone]) [phone]   [employer]  [phone]    Agencies that provide inexpensive medical care: Organization         Address  Phone   Notes  [facility]  [phone]   [facility]    [phone]   [facility] [address] [place], [place] [address] [phone]   [facility] [place] [address], [place] [phone]   Planned Parenthood    [phone]) [phone]   [facility]    [phone]   [facility]  [address], [place] Phone:  [phone], Fax:  [phone]) [phone] Hours of Operation:  9 am - 6 pm, M-F.  Also accepts Medicaid/Medicare and self-pay.  [facility]  [address], [place] Phone: [phone]) [phone], Fax: [phone]) [phone]. Hours of Operation:  8:30 am - 5:30 pm, M-F.  Also accepts Medicaid and self-pay.  [facility] [place] [address], [place] Phone: [phone]) [phone]   [facility] [address], [place], [place] [phone], Ext. [phone] Mondays & Thursdays: 7-9 AM.  First 15 patients are seen on a first come, first serve basis.    Medicaid-accepting [facility] Providers:  Organization         Address  Phone   Notes  [facility] [address], [place] [phone]) [phone] Also accepts self-pay patients.  [facility] [address] [place], [place]  [phone]) [phone]   [facility] [address], [address], [place] [phone]   [facility] [address] [place] [address], [place] [phone]   Renaye RakersVeita Bland [address], [place]   [phone] Only accepts [place] Access [place] patients after they have their name applied to their card.  Self-Pay (no insurance) in [facility]:  Organization         Address  Phone   Notes  Sickle Cell Patients, [facility] [address], [place] [phone]) [phone]   [facility] [address], [place] [phone]   [facility] [place]  [address], [place] [phone]   [facility]/Dr. Osei-Bonsu  [address] [place] [address], [place] or [id number], [place] [phone] Phone number for both [place] and [place] locations is the same.  [facility] [address], [place] [phone]   [facility] [address] [place] [address], [place] or [address] [phone]   [facility] [address], [place] [phone], phone; [phone], fax Sees patients 1st and 3rd Saturday of every month.  Must not qualify for public or private insurance (i.e. Medicaid, Medicare, [place] Health Choice, Veterans' Benefits)  Household income should be no more than 200% of the poverty level  The clinic cannot treat you if you are pregnant or think you are pregnant  Sexually transmitted diseases are not treated at the clinic.    Dental Care: Organization         Address  Phone  Notes  [facility] [address], [place] [phone] Accepts children up to age 22 who are enrolled in [place]icaid or [place] Health Choice; pregnant women with a Medicaid card; and children who have applied for Medicaid or [place] Health Choice, but were declined, whose parents can pay a reduced fee at time of service.  [facility] Department of [facility] [place]  [address], [place] [phone]) [phone] Accepts children up to age 22 who are enrolled in [place]icaid or [place] Health Choice; pregnant women with a Medicaid card; and children who have applied for Medicaid or  Health Choice, but were declined, whose parents can pay a reduced fee at time of service.  [facility]  [address], [place] [phone]) [phone] Patients are seen by appointment only. Walk-ins are not accepted. [facility] will see patients 22 years of age and older. Monday - Tuesday (8am-5pm) Most Wednesdays (8:30-5pm) $30 per visit, cash only  [facility]  [address], [facility] [phone] Patients are seen by appointment only. Walk-ins are not accepted. [facility] will see patients 22 years of age and older. One Wednesday Evening (Monthly: Volunteer Based).  $30 per visit, cash only  [employer] [facility]  [phone] for adults; Children under age 394, call Graduate Pediatric Dentistry at [phone]) [phone]. Children aged 654-14, please call [phone] to request a pediatric application.  Dental services are provided in all areas of dental care including fillings, crowns and bridges, complete and partial dentures, implants, gum treatment, root canals, and extractions. Preventive care is  also provided. Treatment is provided to both adults and children. Patients are selected via a lottery and there is often a waiting list.   [facility] [address], [place]  [phone] [email or web address]   [facility] [address], [place], [place] [phone], Ext. [phone] Second and Fourth Thursday of each month, opens at 6:30 AM; Clinic ends at 9 AM.  Patients are seen on a first-come first-served basis, and a limited number  are seen during each clinic.   [facility]  [address], [place], [place] [phone]) [phone]   Eligibility Requirements You must have lived in [place], [place], or [place] counties for at least the last three months.   You cannot be eligible for state or federal sponsored National Cityhealthcare insurance, including CIGNAVeterans Administration, [place]icaid, or [employer] EntertainmentMedicare.   You generally cannot be eligible for healthcare insurance through your employer.    How to apply: Eligibility screenings are held every Tuesday and Wednesday afternoon from 1:00 pm until 4:00 pm. You do not need an appointment for the interview!  [facility] [address], [place], [place] [phone]   [facility]  [phone]   [facility]  [phone]   [facility]  [phone]    Behavioral Health Resources in the Community: Intensive Outpatient Programs Organization         Address  Phone  Notes  [facility] [address], [place], [place] [phone]   [facility] [address], [place], [place] [phone]   [facility] [address], [place], [place]  [phone]   [facility] [address],  [place], [place] [phone] or [phone]   Substance Abuse Resources Organization         Address  Phone  Notes  [facility]  [phone]   [facility]  [phone]   [facility]  [phone]   [facility]  [phone]   Residential & Outpatient Substance Abuse Program  [phone]   Psychological Services Organization         Address  Phone  Notes  [facility]  [phone]- [phone]   [facility]  [phone]- [phone]   [facility] [address], [place] [phone] or [phone]    Mobile Crisis Teams Organization         Address  Phone  Notes  [facility], Mobile Crisis Care Unit  [phone]   [facility]  [address] [place], [place] [phone]   Doristine LocksSharon DeEsch [address] [place] [phone]    Self-Help/Support Groups Organization         Address  Phone             Notes  [facility] [place] - variety of support groups  [phone] Call for more information  Narcotics Anonymous (NA), [facility] [address], [place]  2 meetings at this location   Statisticianesidential Treatment Programs Organization         Address  Phone  Notes  [facility] [address],    [place]  [phone]   [facility]  [address], [place] [phone], [place], [place] [phone]   [facility] [address], [place] [phone] Admissions: 8am-3pm M-F  [facility] [address]    [place], [place] [phone]   [facility] [address], [place], [place] [phone]   [facility] [address]  [place], [place] [phone]   [facility] - Intensive Outpatient [address], [place], [place] [phone]   [facility] [address]  [place], [place] [phone] or [phone]   [facility] ([facility]) [address] [place], [place] [phone] Accepts Medicaid  [facility] [address]  [place] [phone]  Substance Abuse/Addiction Treatment   [facility] Organization         Address  Phone  Notes  [facility]  [phone]   Domenic Schwab, PhD [address] [place], [place]   [phone] or [phone]   [place] [address] [place], [place] [phone]   [address], [place], [place] [phone] Insurance/Medicaid/sponsorship through [facility] and Families [address]                                    [place], [place] [phone] [place] [address], [place] [phone]    Dr. Adele Schilder  [phone]   [facility] 1) [address]  2) [place] 3)  [place], [place] [phone]  [phone]   [place] [phone] or [phone] (After Hours)

## 2014-04-12 NOTE — ED Notes (Signed)
Pt made aware to return if symptoms worsen or if any life threatening symptoms occur.   

## 2014-04-12 NOTE — ED Notes (Addendum)
Lt. Lateral side of back hurts when pt. Turns torso to rt. Took tylenol but no pain relief. Hx. Of back of spasms.

## 2014-04-12 NOTE — ED Notes (Signed)
Pt. Came back to the [facility] and reported that he went down to the cafeteria to eat,.  Informed Melissa, Consulting civil engineerCharge RN.  Pt. Placed in Room 37

## 2014-04-12 NOTE — ED Notes (Signed)
Pt reports mid back pain starting today.  Pt feels pain when twisting.  Pt has limited movement.

## 2014-04-12 NOTE — ED Notes (Signed)
Called pt in general waiting, peds and subwait.  No answer

## 2014-04-12 NOTE — ED Notes (Signed)
Called pt x1 for room in general waiting area, no answer

## 2014-04-12 NOTE — ED Notes (Signed)
NT and second RN checked for pt, no answer

## 2014-04-12 NOTE — ED Notes (Signed)
Unable to locate patient.

## 2014-04-12 NOTE — ED Provider Notes (Signed)
CSN: [id number]     Arrival date & time 04/12/14  1718 History   First MD Initiated Contact with Patient 04/12/14 1829     Chief Complaint  Patient presents with  . Back Pain     (Consider location/radiation/quality/duration/timing/severity/associated sxs/prior Treatment) HPI Complains of low back pain left-sided paralumbar feels like muscle spasm onset 2 PM today pain is worse with changing positions improved with remaining still moderate to severe, nonradiating no other associated symptoms. No loss of bladder or bowel control no fever no injury. he treated himself with Tylenol today, without relief Past Medical History  Diagnosis Date  . Prostate enlargement    History reviewed. No pertinent past surgical history. History reviewed. No pertinent family history. History  Substance Use Topics  . Smoking status: Never Smoker   . Smokeless tobacco: Not on file  . Alcohol Use: Yes     Comment: occasionally   no illicit drug use  Review of Systems  Musculoskeletal: Positive for back pain.  All other systems reviewed and are negative.     Allergies  Review of patient's allergies indicates no known allergies.  Home Medications   Prior to Admission medications   Medication Sig Start Date End Date Taking? Authorizing Provider  Menthol-Methyl Salicylate (MUSCLE RUB) 10-15 % CREA Apply 1 application topically as needed for muscle pain.    Historical Provider, MD  Multiple Vitamins-Minerals (MULTIVITAMIN PO) Take 1 tablet by mouth daily.    Historical Provider, MD  naproxen sodium (ANAPROX) 220 MG tablet Take 440 mg by mouth 2 (two) times daily as needed (for pain).    Historical Provider, MD   BP 138/81 mmHg  Pulse 83  Temp(Src) 98.3 F (36.8 C) (Oral)  Resp 22  Ht 6\' 1"  (1.854 m)  Wt 205 lb (92.987 kg)  BMI 27.05 kg/m2  SpO2 95% Physical Exam  Constitutional: He appears well-developed and well-nourished.  HENT:  Head: Normocephalic and atraumatic.  Eyes: Conjunctivae  are normal. Pupils are equal, round, and reactive to light.  Neck: Neck supple. No tracheal deviation present. No thyromegaly present.  Cardiovascular: Normal rate and regular rhythm.   No murmur heard. Pulmonary/Chest: Effort normal and breath sounds normal.  Abdominal: Soft. Bowel sounds are normal. He exhibits no distension. There is no tenderness.  Musculoskeletal: Normal range of motion. He exhibits no edema or tenderness.  No tenderness along spine. Left-sided paralumbar spasm  Neurological: He is alert. Coordination normal.  Skin: Skin is warm and dry. No rash noted.  Psychiatric: He has a normal mood and affect.  Nursing note and vitals reviewed.   ED Course  Procedures (including critical care time) Labs Review Labs Reviewed - No data to display  Imaging Review No results found.   EKG Interpretation None      MDM  No red flags for back pain pain felt to be musculoskeletal in etiology. Lan prescriptions Motrin, Flexeril. Referral to resource guide for primary care physician Final diagnoses:  None   Diagnosis low back pain     Doug SouSam Akiva Brassfield, MD 04/12/14 1857

## 2014-04-14 ENCOUNTER — Emergency Department (HOSPITAL_COMMUNITY)
Admission: EM | Admit: 2014-04-14 | Discharge: 2014-04-14 | Disposition: A | Discharge status: Home / Self Care | Payer: PRIVATE HEALTH INSURANCE | Attending: Emergency Medicine | Admitting: Emergency Medicine

## 2014-04-14 ENCOUNTER — Encounter (HOSPITAL_COMMUNITY): Payer: Self-pay

## 2014-04-14 DIAGNOSIS — R220 Localized swelling, mass and lump, head: Secondary | ICD-10-CM | POA: Diagnosis not present

## 2014-04-14 DIAGNOSIS — Z87438 Personal history of other diseases of male genital organs: Secondary | ICD-10-CM | POA: Diagnosis not present

## 2014-04-14 MED ORDER — DIPHENHYDRAMINE HCL 25 MG PO CAPS
25.0000 mg | ORAL_CAPSULE | Freq: Once | ORAL | Status: AC
  Administered 2014-04-14: 25 mg via ORAL
  Filled 2014-04-14: qty 1

## 2014-04-14 MED ORDER — LORATADINE 10 MG PO TABS: 10.0000 mg | ORAL_TABLET | Freq: Every day | ORAL | Status: AC

## 2014-04-14 NOTE — ED Notes (Signed)
Pt states he began having some upper lip swelling last night.  Pt states he woke up this morning with significant lip swelling.  Pt states he can feel a "knot" in his lip on the L side, but denies any pain.

## 2014-04-14 NOTE — ED Provider Notes (Signed)
CSN: [id number]     Arrival date & time 04/14/14  1641 History  This chart was scribed for non-physician practitioner working with No att. providers found by Elveria Risingimelie Horne, ED Scribe. This patient was seen in room TR09C/TR09C and the patient's care was started at 7:37 PM.   Chief Complaint  Patient presents with  . Oral Swelling   The history is provided by the patient and a relative. No language interpreter was used.   HPI Comments: Marcus Jefferson is a 23 y.o. male who presents to the Emergency Department complaining of worsening upper lip swelling, onset last night. Patient reports increased swelling upon wakening this morning. Patient locates nodule/pimple in his mustache, but denies any pain at this site. Patient denies injury or trauma. Patient reports new antibiotic and Tylenol for treatment of UTI. Patient reports recent trip to the barber this week ago where his facial hair was trimmed. Patient reports treatment with alcohol, ice pack to reduce the swelling, and warm compressions without improvment. Patient is not a smoker. Patient shares medical issues including prostate enlargement and/or enlargement of his pelvic floor.    Past Medical History  Diagnosis Date  . Prostate enlargement    History reviewed. No pertinent past surgical history. No family history on file. History  Substance Use Topics  . Smoking status: Never Smoker   . Smokeless tobacco: Not on file  . Alcohol Use: Yes     Comment: occasionally    Review of Systems  Constitutional: Negative for fever and chills.  HENT: Positive for facial swelling.    Allergies  Review of patient's allergies indicates no known allergies.  Home Medications   Prior to Admission medications   Medication Sig Start Date End Date Taking? Authorizing Provider  cyclobenzaprine (FLEXERIL) 10 MG tablet Take 0.5 tablets (5 mg total) by mouth 3 (three) times daily as needed for muscle spasms. 04/12/14  Yes Doug SouSam Jacubowitz, MD  ibuprofen  (ADVIL,MOTRIN) 800 MG tablet Take 1 tablet (800 mg total) by mouth every 8 (eight) hours as needed (Back pain. Take with food). 04/12/14  Yes Doug SouSam Jacubowitz, MD  loratadine (CLARITIN) 10 MG tablet Take 1 tablet (10 mg total) by mouth daily. One po daily x 5 days 04/14/14   Monte FantasiaJoseph W Manessa Buley, PA-C   Triage Vitals: BP 133/55 mmHg  Pulse 70  Temp(Src) 98.5 F (36.9 C) (Oral)  Resp 18  Ht 6\' 1"  (1.854 m)  Wt 205 lb (92.987 kg)  BMI 27.05 kg/m2  SpO2 96% Physical Exam  Constitutional: He is oriented to person, place, and time. He appears well-developed and well-nourished. No distress.  HENT:  Head: Normocephalic and atraumatic.  Mouth/Throat: Oropharynx is clear and moist.  No obvious swelling of the oral mucosa, gingival, tongue, floor or roof of mouth. Posterior oropharynx without erythema, edema or tonsillar swelling.   Eyes: EOM are normal.  Neck: Neck supple. No tracheal deviation present.  Cardiovascular: Normal rate.   Pulmonary/Chest: Effort normal. No respiratory distress.  Musculoskeletal: Normal range of motion.  Neurological: He is alert and oriented to person, place, and time.  Skin: Skin is warm and dry.  Unilateral upper lip swelling without erythema, warmth, or spread of swelling to oral mucosa. Also noted 1x1cm inflamed hair follicle just superior to his lip swelling.   Psychiatric: He has a normal mood and affect. His behavior is normal.  Nursing note and vitals reviewed.   ED Course  Procedures (including critical care time)  COORDINATION OF CARE: 3:59 AM- Discussed treatment  plan with patient at bedside and patient agreed to plan.   Labs Review Labs Reviewed - No data to display  Imaging Review No results found.   EKG Interpretation None      MDM   Final diagnoses:  Swelling of upper lip    Mild amount of swelling to left side of upper lip with mild folliculitis just superior to the swollen area. Exam non-concerning for allergic reaction, oral  swelling, angioedema. Exam also non-concerning for cellulitis, gross abscess.  Patient denying any new products, food. Patient recently placed on new antibiotic for UTI, however states he has recently finished course. Mild swelling in his upper lip inconsistent with a medication reaction. No concern for Goldman SachsSteven Johnson's, anaphylaxis from medication. No concern for airway compromise. No signs and symptoms consistent with anaphylaxis or allergic reaction. I encouraged patient to use Claritin on a daily basis and Benadryl when necessary for mild swelling. I also encouraged patient to use ice on the swollen region to reduce swelling. I encouraged patient to use warm soaks on the folliculitis to help prevent any infection. I discussed return precautions with patient, and patient was agreeable to this plan. I encouraged patient to call or return to the ER should he have any questions or concerns.  I personally performed the services described in this documentation, which was scribed in my presence. The recorded information has been reviewed and is accurate.  BP 133/55 mmHg  Pulse 70  Temp(Src) 98.5 F (36.9 C) (Oral)  Resp 18  Ht 6\' 1"  (1.854 m)  Wt 205 lb (92.987 kg)  BMI 27.05 kg/m2  SpO2 96%  Signed,  Ladona MowJoe Tashia Leiterman, PA-C 3:59 AM    Monte FantasiaJoseph W Hosie Sharman, PA-C 04/15/14 0359  Juliet RudeNathan R. Rubin PayorPickering, MD 04/17/14 1321

## 2014-04-14 NOTE — Discharge Instructions (Signed)
Use Benadryl and Claritin for your lip. Return to the ER if you develop any severe swelling, severe pain, worsening of symptoms, difficulty swallowing, difficulty breathing, rash, nausea, vomiting.

## 2014-05-12 ENCOUNTER — Emergency Department (HOSPITAL_COMMUNITY): Payer: PRIVATE HEALTH INSURANCE

## 2014-05-12 ENCOUNTER — Encounter (HOSPITAL_COMMUNITY): Payer: Self-pay | Admitting: *Deleted

## 2014-05-12 ENCOUNTER — Emergency Department (HOSPITAL_COMMUNITY)
Admission: EM | Admit: 2014-05-12 | Discharge: 2014-05-12 | Disposition: A | Discharge status: Home / Self Care | Payer: PRIVATE HEALTH INSURANCE | Attending: Emergency Medicine | Admitting: Emergency Medicine

## 2014-05-12 DIAGNOSIS — Z79899 Other long term (current) drug therapy: Secondary | ICD-10-CM | POA: Diagnosis not present

## 2014-05-12 DIAGNOSIS — M549 Dorsalgia, unspecified: Secondary | ICD-10-CM | POA: Diagnosis present

## 2014-05-12 DIAGNOSIS — R0781 Pleurodynia: Secondary | ICD-10-CM | POA: Diagnosis not present

## 2014-05-12 DIAGNOSIS — R109 Unspecified abdominal pain: Secondary | ICD-10-CM | POA: Insufficient documentation

## 2014-05-12 DIAGNOSIS — R110 Nausea: Secondary | ICD-10-CM | POA: Diagnosis not present

## 2014-05-12 DIAGNOSIS — Z87448 Personal history of other diseases of urinary system: Secondary | ICD-10-CM | POA: Insufficient documentation

## 2014-05-12 MED ORDER — IBUPROFEN 800 MG PO TABS: 800.0000 mg | ORAL_TABLET | Freq: Three times a day (TID) | ORAL | Status: AC

## 2014-05-12 MED ORDER — IBUPROFEN 400 MG PO TABS
800.0000 mg | ORAL_TABLET | Freq: Once | ORAL | Status: AC
  Administered 2014-05-12: 800 mg via ORAL
  Filled 2014-05-12: qty 2

## 2014-05-12 MED ORDER — METHOCARBAMOL 500 MG PO TABS: 500.0000 mg | ORAL_TABLET | Freq: Two times a day (BID) | ORAL | Status: AC

## 2014-05-12 NOTE — Discharge Instructions (Signed)
Stop taking the Flexeril.  Instead you can start taking Robaxin..  Do not drive or operate heavy machinery for 4-6 hours after taking the medication.

## 2014-05-12 NOTE — ED Notes (Signed)
Pt reports pain has not improved since his last visit on 04-14-14. Pt is afraid he has cancer because hed has seen a lot of people has cancer.

## 2014-05-12 NOTE — ED Provider Notes (Signed)
CSN: [id number]     Arrival date & time 05/12/14  0917 History  This chart was scribed for non-physician practitioner Santiago GladHeather Viona Hosking, PA-C working with Audree CamelScott T Goldston, MD by Conchita ParisNadim Abuhashem, ED Scribe. This patient was seen in TR05C/TR05C and the patient's care was started at 9:56 AM.   Chief Complaint  Patient presents with  . Back Pain    Lt sided   Patient is a 24 y.o. male presenting with back pain. The history is provided by the patient. No language interpreter was used.  Back Pain Associated symptoms: abdominal pain   Associated symptoms: no chest pain     HPI Comments: Marcus HurtHoward Cahee is a 24 y.o. male who presents to the Emergency Department complaining of left sided back pain that has been ongoing for two weeks. Pt was seen for muscle spasms two weeks ago which has not improved with prescribed motrin and flexeril. He reports the pain has radiated to his left rib cage and abdomen. The pain is described as a throbbing sensation. Pt says the pain worsens with certain movements, particularly when when he puts his head down. Rotating his torso does not worsen the pain. He had nausea last night as an associated symptom. Pt denies SOB, fever, vomiting and CP. His work does not involve heavy lifting and he has not lifted anything heavy recently.  He denies numbness, tingling, or bowel/bladder incontinence.    Past Medical History  Diagnosis Date  . Prostate enlargement    History reviewed. No pertinent past surgical history. History reviewed. No pertinent family history. History  Substance Use Topics  . Smoking status: Never Smoker   . Smokeless tobacco: Not on file  . Alcohol Use: Yes     Comment: occasionally    Review of Systems  Respiratory: Negative for shortness of breath.   Cardiovascular: Negative for chest pain.  Gastrointestinal: Positive for nausea and abdominal pain. Negative for vomiting.  Musculoskeletal: Positive for back pain.   Allergies  Review of patient's  allergies indicates no known allergies.  Home Medications   Prior to Admission medications   Medication Sig Start Date End Date Taking? Authorizing Provider  cyclobenzaprine (FLEXERIL) 10 MG tablet Take 0.5 tablets (5 mg total) by mouth 3 (three) times daily as needed for muscle spasms. 04/12/14   Doug SouSam Jacubowitz, MD  ibuprofen (ADVIL,MOTRIN) 800 MG tablet Take 1 tablet (800 mg total) by mouth every 8 (eight) hours as needed (Back pain. Take with food). 04/12/14   Doug SouSam Jacubowitz, MD  loratadine (CLARITIN) 10 MG tablet Take 1 tablet (10 mg total) by mouth daily. One po daily x 5 days 04/14/14   Monte FantasiaJoseph W Mintz, PA-C   BP 123/58 mmHg  Pulse 72  Temp(Src) 97.6 F (36.4 C) (Oral)  Resp 18  Ht 6\' 1"  (1.854 m)  SpO2 100% Physical Exam  Constitutional: He is oriented to person, place, and time. He appears well-developed and well-nourished. No distress.  HENT:  Head: Normocephalic and atraumatic.  Eyes: Pupils are equal, round, and reactive to light.  Neck: Normal range of motion.  Cardiovascular: Normal rate, regular rhythm and normal heart sounds.   Pulmonary/Chest: Effort normal and breath sounds normal. No respiratory distress.  Abdominal: Soft. There is no tenderness.  Musculoskeletal: Normal range of motion.       Thoracic back: He exhibits normal range of motion, no tenderness, no bony tenderness, no swelling, no edema and no deformity.       Lumbar back: He exhibits normal range of  motion, no tenderness, no bony tenderness, no swelling, no edema and no deformity.  TTP left posterior lateral lower rib cage.  Neurological: He is alert and oriented to person, place, and time. He has normal strength. No sensory deficit. Gait normal.  Skin: Skin is warm and dry.  Psychiatric: He has a normal mood and affect. His behavior is normal.  Nursing note and vitals reviewed.   ED Course  Procedures  DIAGNOSTIC STUDIES: Oxygen Saturation is 100% on room air, normal by my interpretation.     COORDINATION OF CARE: 10:03 AM Discussed treatment plan with pt at bedside and pt agreed to plan.   Labs Review Labs Reviewed - No data to display  Imaging Review No results found.   EKG Interpretation None      MDM   Final diagnoses:  None   Patient presents with left sided back pain and left sided rib pain.  No SOB.  VSS.   No acute injury or trauma.  Rib/CXR negative. Feel that the pain is most likely musculoskeletal.  Stable for discharge.  Return precautions given. Patient given PCP referral.     Santiago Glad, PA-C 05/14/14 2132  Audree Camel, MD 05/17/14 [phone]

## 2015-09-13 IMAGING — CR DG RIBS W/ CHEST 3+V*L*
5 series · 5 of 5 positions shown · non-contrast
Comparison: None.

CLINICAL DATA: Left rib cage pain for 2 weeks, no known injury

EXAM:
LEFT RIBS AND CHEST - 3+ VIEW

[chest pa]
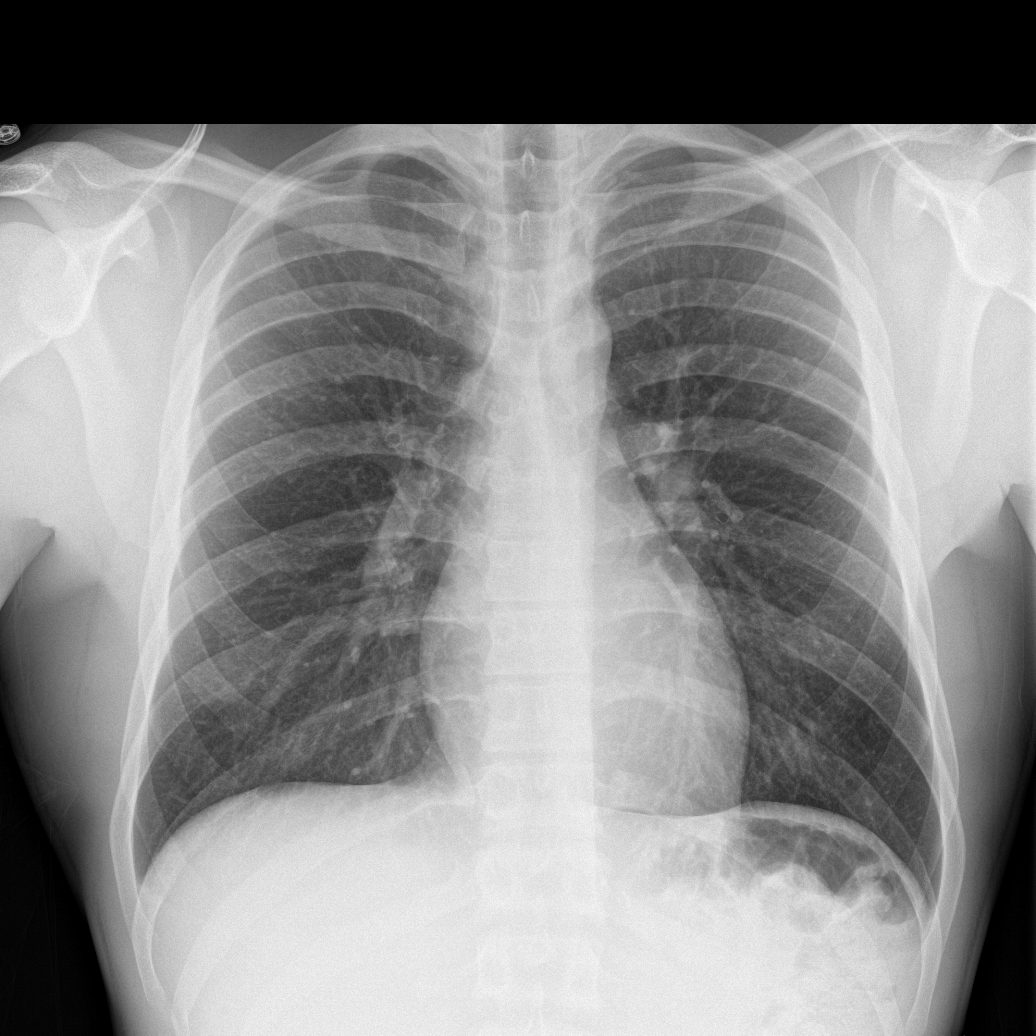

[rib pa (1 of 2)]
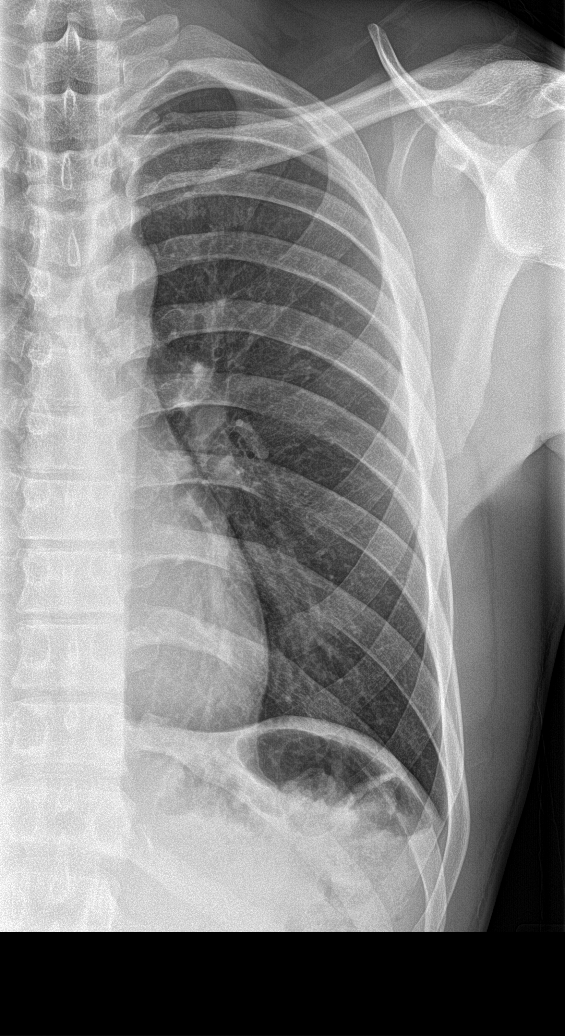

[rib pa (2 of 2)]
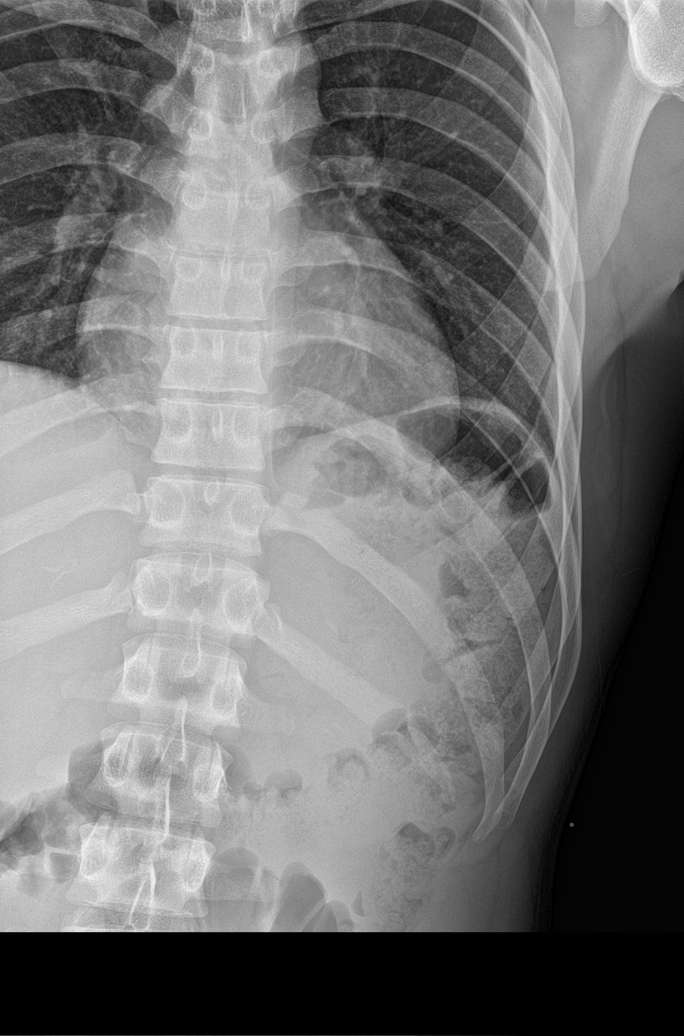

[rib pa obl (1 of 2)]
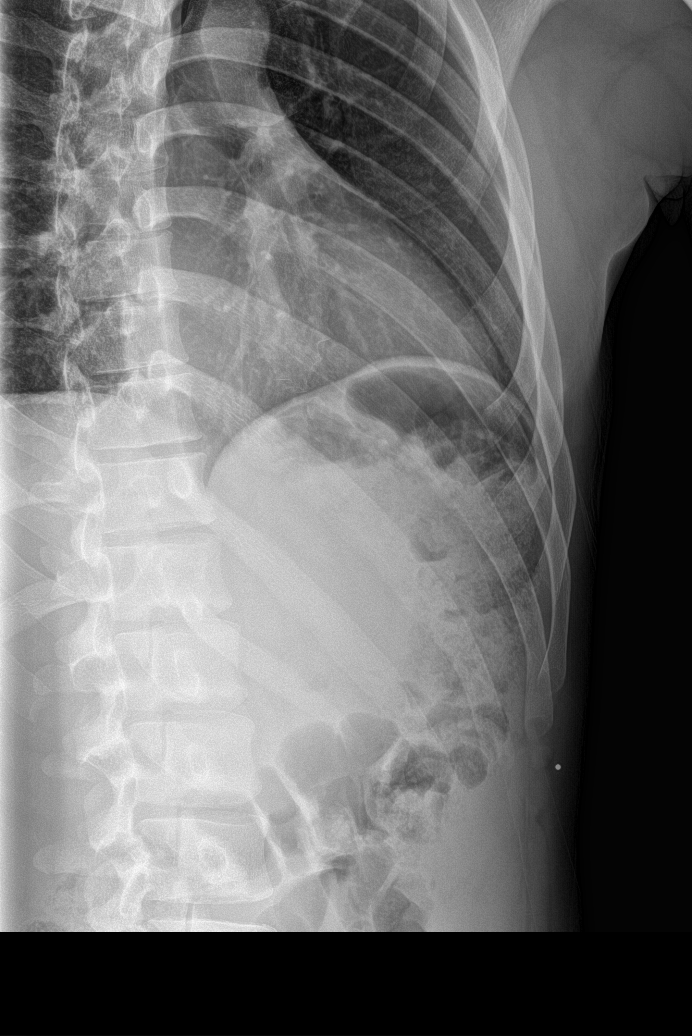

[rib pa obl (2 of 2)]
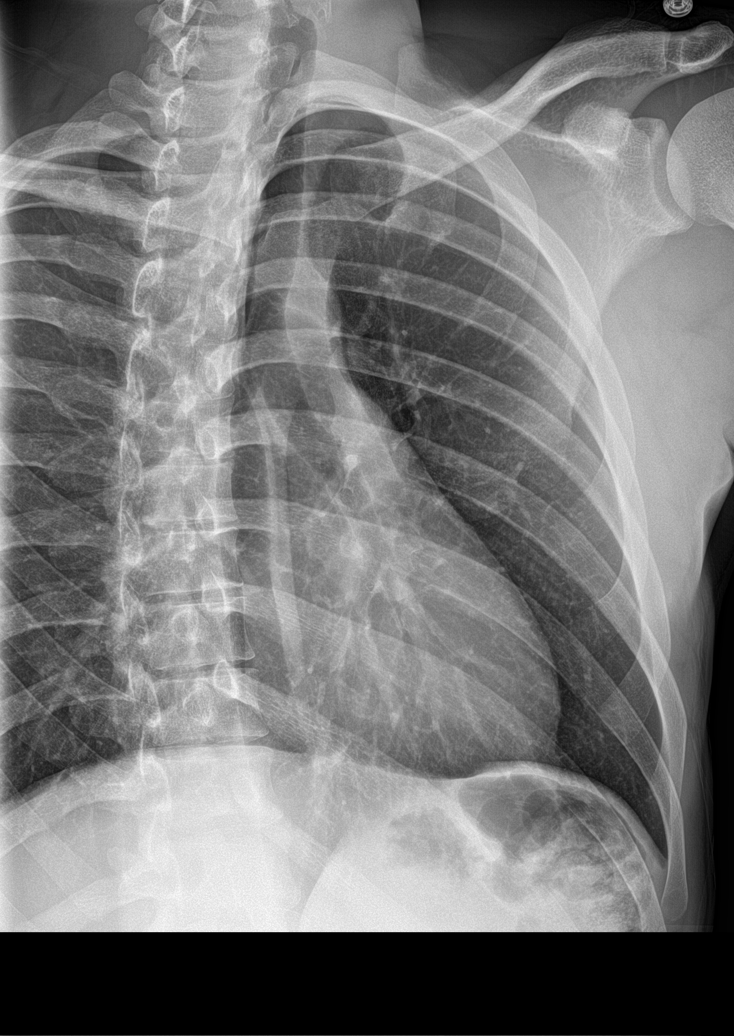

[5 of 5 positions shown; findings below may reference images not displayed]

FINDINGS: No fracture or other bone lesions are seen involving the ribs. There
is no evidence of pneumothorax or pleural effusion. Both lungs are
clear. Heart size and mediastinal contours are within normal limits.
IMPRESSION: Negative.

## 2017-03-30 ENCOUNTER — Emergency Department (HOSPITAL_COMMUNITY)
Admission: EM | Admit: 2017-03-30 | Discharge: 2017-03-30 | Discharge status: Absent without leave | Payer: No Typology Code available for payment source

## 2017-04-21 ENCOUNTER — Other Ambulatory Visit: Payer: Self-pay

## 2017-04-21 ENCOUNTER — Emergency Department (HOSPITAL_COMMUNITY): Admission: EM | Admit: 2017-04-21 | Discharge: 2017-04-21 | Discharge status: Against Medical Advice | Payer: Self-pay

## 2017-04-21 NOTE — ED Notes (Signed)
Pt left. Pt stated that he was going to go home and might come back another day I told pt we would like to se him and he still said that he wanted to leave

## 2020-03-21 ENCOUNTER — Emergency Department (HOSPITAL_COMMUNITY): Admission: EM | Admit: 2020-03-21 | Discharge: 2020-03-21 | Discharge status: Against Medical Advice | Payer: Self-pay

## 2020-03-22 ENCOUNTER — Encounter (HOSPITAL_COMMUNITY): Payer: Self-pay | Admitting: *Deleted

## 2020-03-22 ENCOUNTER — Other Ambulatory Visit: Payer: Self-pay

## 2020-03-22 ENCOUNTER — Emergency Department (HOSPITAL_COMMUNITY)
Admission: EM | Admit: 2020-03-22 | Discharge: 2020-03-22 | Disposition: A | Discharge status: Home / Self Care | Payer: Self-pay | Attending: Emergency Medicine | Admitting: Emergency Medicine

## 2020-03-22 DIAGNOSIS — R220 Localized swelling, mass and lump, head: Secondary | ICD-10-CM | POA: Insufficient documentation

## 2020-03-22 MED ORDER — NAPROXEN 500 MG PO TABS: 500.0000 mg | ORAL_TABLET | Freq: Two times a day (BID) | ORAL | 0 refills | Status: DC

## 2020-03-22 MED ORDER — CLINDAMYCIN HCL 300 MG PO CAPS: 300.0000 mg | ORAL_CAPSULE | Freq: Three times a day (TID) | ORAL | 0 refills | Status: AC

## 2020-03-22 NOTE — ED Triage Notes (Signed)
Left facial swelling x 3 days ? Related to tooth problems.

## 2020-03-22 NOTE — ED Provider Notes (Signed)
[facility]-EMERGENCY DEPT Provider Note   CSN: [id number] Arrival date & time: 03/22/20  0820     History Chief Complaint  Patient presents with  . Facial Swelling    Marcus Jefferson is a 29 y.o. male with recent past medical history who presents for evaluation of facial swelling.  Patient states he developed pain to his left cheek 2 days ago.  Patient states he woke this morning and he developed some slight left-sided facial swelling.  This does not extend into his periorbital area or into the submandibular area.  He has been able to eat and drink however has pain to his inner buccal mucosa and left posterior dentition.  He is not followed by dentistry.  Denies fever, chills, nausea, vomiting, neck pain, neck stiffness, drooling, dysphagia or trismus.  He is not take anything for symptoms.  Denies additional aggravating or alleviating factors.  History obtained from patient and past medical records.  No interpreter is used.   HPI     Past Medical History:  Diagnosis Date  . Prostate enlargement     There are no problems to display for this patient.   History reviewed. No pertinent surgical history.     No family history on file.  Social History   Tobacco Use  . Smoking status: Never Smoker  . Smokeless tobacco: Never Used  Substance Use Topics  . Alcohol use: Yes    Comment: occasionally  . Drug use: No    Home Medications Prior to Admission medications   Medication Sig Start Date End Date Taking? Authorizing Provider  clindamycin (CLEOCIN) 300 MG capsule Take 1 capsule (300 mg total) by mouth 3 (three) times daily for 7 days. 03/22/20 03/29/20  Franki Stemen A, PA-C  ibuprofen (ADVIL,MOTRIN) 800 MG tablet Take 1 tablet (800 mg total) by mouth 3 (three) times daily. 05/12/14   Santiago Glad, PA-C  loratadine (CLARITIN) 10 MG tablet Take 1 tablet (10 mg total) by mouth daily. One po daily x 5 days 04/14/14   Ladona Mow, PA-C  methocarbamol  (ROBAXIN) 500 MG tablet Take 1 tablet (500 mg total) by mouth 2 (two) times daily. 05/12/14   Santiago Glad, PA-C  naproxen (NAPROSYN) 500 MG tablet Take 1 tablet (500 mg total) by mouth 2 (two) times daily. 03/22/20   Billie Trager A, PA-C    Allergies    Patient has no known allergies.  Review of Systems   Review of Systems  Constitutional: Negative.   HENT: Positive for dental problem and facial swelling.   Respiratory: Negative.   Cardiovascular: Negative.   Gastrointestinal: Negative.   Genitourinary: Negative.   Musculoskeletal: Negative.   Skin: Negative.   Neurological: Negative.   All other systems reviewed and are negative.   Physical Exam Updated Vital Signs BP 138/75 (BP Location: Left Arm)   Pulse 64   Temp 98.4 F (36.9 C) (Oral)   Resp 16   Ht 6' (1.829 m)   Wt 90.7 kg   SpO2 100%   BMI 27.12 kg/m   Physical Exam Vitals and nursing note reviewed.  Constitutional:      General: He is not in acute distress.    Appearance: He is well-developed. He is not ill-appearing, toxic-appearing or diaphoretic.  HENT:     Head: Normocephalic and atraumatic. No raccoon eyes, Battle's sign, abrasion, contusion or laceration.     Jaw: There is normal jaw occlusion.      Comments: No submandibular swelling, fluctuance or induration.  No evidence of Ludwig's angina    Mouth/Throat:     Lips: Pink.     Mouth: Mucous membranes are moist.     Dentition: Abnormal dentition. Does not have dentures. Dental caries present. No dental abscesses.     Tongue: No lesions. Tongue does not deviate from midline.     Pharynx: Oropharynx is clear. Uvula midline.     Tonsils: No tonsillar exudate or tonsillar abscesses. 0 on the right. 0 on the left.      Comments: No drooling, dysphagia or trismus.  Tenderness to inner superior buccal mucous. No fluctuance or induration.  No drooling, dysphagia or trismus.  Tongue midline.  No sublingual swelling.  No PTA or RPA.  No pooling of  oral secretions Eyes:     Extraocular Movements: Extraocular movements intact.     Conjunctiva/sclera: Conjunctivae normal.     Pupils: Pupils are equal, round, and reactive to light.     Comments: No periorbital edema, erythema or warmth.  EOMs intact without pain.  Neck:     Trachea: Trachea and phonation normal.     Comments: No neck stiffness or neck rigidity. Cardiovascular:     Rate and Rhythm: Normal rate and regular rhythm.     Pulses: Normal pulses.          Radial pulses are 2+ on the right side and 2+ on the left side.     Heart sounds: Normal heart sounds.  Pulmonary:     Effort: Pulmonary effort is normal. No respiratory distress.     Breath sounds: Normal breath sounds and air entry.     Comments: Speaks in full sentences without difficulty Abdominal:     General: There is no distension.     Palpations: Abdomen is soft.  Musculoskeletal:        General: Normal range of motion.     Cervical back: Full passive range of motion without pain, normal range of motion and neck supple.  Lymphadenopathy:     Cervical: No cervical adenopathy.  Skin:    General: Skin is warm and dry.     Capillary Refill: Capillary refill takes less than 2 seconds.     Comments: Very minimal swelling over left inferior zygomatic process.  Does not extend into periorbital region.  Neurological:     Mental Status: He is alert.     Cranial Nerves: Cranial nerves are intact.     Sensory: Sensation is intact.     Motor: Motor function is intact.     Gait: Gait is intact.     Comments: Cranial nerves II through XII grossly intact.     ED Results / Procedures / Treatments   Labs (all labs ordered are listed, but only abnormal results are displayed) Labs Reviewed - No data to display  EKG None  Radiology No results found.  Procedures Procedures (including critical care time)  Medications Ordered in ED Medications - No data to display  ED Course  I have reviewed the triage vital  signs and the nursing notes.  Pertinent labs & imaging results that were available during my care of the patient were reviewed by me and considered in my medical decision making (see chart for details).  29 year old presents for evaluation of left-sided facial swelling.  He is afebrile, nonseptic, not ill-appearing.  Does have tenderness to inner buccal mucosa over maxilla.  No evidence of drainable abscess.  He has no drooling, dysphagia or trismus.  No pooling of oral secretions.  No evidence of PTA or RPA.  Some very minimal soft tissue swelling however no erythema, warmth, evidence of drainable cheek abscess.  Does not extend into periorbital region.  PERRLA and EOMs intact without pain.  No evidence of periorbital or orbital cellulitis.  Swelling does not extend into sublingual area or submandibular area.  No evidence of Ludwig's angina.  I low suspicion for deep space infection.  Question possible early developing abscess.  Will start antibiotics, NSAIDs, heat and have him follow-up in 2 days for recheck.  Tolerating p.o. intake here in ED without difficulty.  No evidence of airway obstruction.  The patient has been appropriately medically screened and/or stabilized in the ED. I have low suspicion for any other emergent medical condition which would require further screening, evaluation or treatment in the ED or require inpatient management.  Patient is hemodynamically stable and in no acute distress.  Patient able to ambulate in department prior to ED.  Evaluation does not show acute pathology that would require ongoing or additional emergent interventions while in the emergency department or further inpatient treatment.  I have discussed the diagnosis with the patient and answered all questions.  Pain is been managed while in the emergency department and patient has no further complaints prior to discharge.  Patient is comfortable with plan discussed in room and is stable for discharge at this time.   I have discussed strict return precautions for returning to the emergency department.  Patient was encouraged to follow-up with PCP/specialist refer to at discharge.    MDM Rules/Calculators/A&P                          Final Clinical Impression(s) / ED Diagnoses Final diagnoses:  Left facial swelling    Rx / DC Orders ED Discharge Orders         Ordered    clindamycin (CLEOCIN) 300 MG capsule  3 times daily        03/22/20 0917    naproxen (NAPROSYN) 500 MG tablet  2 times daily        03/22/20 0917           Chris Cripps A, PA-C 03/22/20 0936    Bethann Berkshire, MD 03/23/20 [phone]

## 2020-04-01 ENCOUNTER — Emergency Department (HOSPITAL_COMMUNITY)
Admission: EM | Admit: 2020-04-01 | Discharge: 2020-04-01 | Disposition: A | Discharge status: Home / Self Care | Payer: Self-pay | Attending: Emergency Medicine | Admitting: Emergency Medicine

## 2020-04-01 ENCOUNTER — Encounter (HOSPITAL_COMMUNITY): Payer: Self-pay

## 2020-04-01 DIAGNOSIS — K047 Periapical abscess without sinus: Secondary | ICD-10-CM | POA: Insufficient documentation

## 2020-04-01 DIAGNOSIS — Z760 Encounter for issue of repeat prescription: Secondary | ICD-10-CM | POA: Insufficient documentation

## 2020-04-01 MED ORDER — AMOXICILLIN-POT CLAVULANATE 875-125 MG PO TABS: 1.0000 | ORAL_TABLET | Freq: Two times a day (BID) | ORAL | 0 refills | Status: DC

## 2020-04-01 NOTE — Discharge Instructions (Signed)
We saw you in the ER for the toothache. °No evidence of deep infection, no evidence of pus that can be drained out. °YOU WILL NEED TO SEE A DENTIST to get appropriate care. °Please take the antibiotics and pain meds provided in the interval. ° °Return to the ER if there is drooling, difficulty breathing, difficulty opening mouth, fevers, chills, confusion, headaches, seizures. ° °

## 2020-04-01 NOTE — ED Triage Notes (Signed)
Pt presents with c/o needing a medication refill. Pt was recently seen for dental pain and treated for a tooth abscess. Pt reports his dog got into the last 6 abx pills that he had and the swelling is still present so he would like a refill of his antibiotic medication.

## 2020-04-03 NOTE — ED Provider Notes (Signed)
[facility]-EMERGENCY DEPT Provider Note   CSN: [id number] Arrival date & time: 04/01/20  4098     History Chief Complaint  Patient presents with  . Medication Refill    Marcus Jefferson is a 29 y.o. male.  HPI     29 year old comes in a chief complaint of medication refill.  Patient was seen for a dental infection recently and prescribed clindamycin.  His dog got into the antibiotic so he did not complete the course.  He was feeling better however, the swelling had gone down.  However recently the swelling is returning again.  Patient has no associated nausea, vomiting, fevers, chills, difficulty opening mouth, headache.  He is otherwise healthy.  He is awaiting activation of her dental savings plan, and therefore has not followed up with the dentist yet.  Past Medical History:  Diagnosis Date  . Prostate enlargement     There are no problems to display for this patient.   History reviewed. No pertinent surgical history.     History reviewed. No pertinent family history.  Social History   Tobacco Use  . Smoking status: Never Smoker  . Smokeless tobacco: Never Used  Substance Use Topics  . Alcohol use: Yes    Comment: occasionally  . Drug use: No    Home Medications Prior to Admission medications   Medication Sig Start Date End Date Taking? Authorizing Provider  amoxicillin-clavulanate (AUGMENTIN) 875-125 MG tablet Take 1 tablet by mouth every 12 (twelve) hours. 04/01/20   Derwood Kaplan, MD  ibuprofen (ADVIL,MOTRIN) 800 MG tablet Take 1 tablet (800 mg total) by mouth 3 (three) times daily. 05/12/14   Santiago Glad, PA-C  loratadine (CLARITIN) 10 MG tablet Take 1 tablet (10 mg total) by mouth daily. One po daily x 5 days 04/14/14   Ladona Mow, PA-C  methocarbamol (ROBAXIN) 500 MG tablet Take 1 tablet (500 mg total) by mouth 2 (two) times daily. 05/12/14   Santiago Glad, PA-C  naproxen (NAPROSYN) 500 MG tablet Take 1 tablet (500 mg total) by  mouth 2 (two) times daily. 03/22/20   Henderly, Britni A, PA-C    Allergies    Patient has no known allergies.  Review of Systems   Review of Systems  Constitutional: Negative for fever.  HENT: Positive for dental problem. Negative for ear pain and hearing loss.   Allergic/Immunologic: Negative for immunocompromised state.  Neurological: Negative for headaches.    Physical Exam Updated Vital Signs BP (!) 143/68 (BP Location: Left Arm)   Pulse 60   Temp 97.9 F (36.6 C) (Oral)   Resp 18   Ht 6' (1.829 m)   Wt 88.5 kg   SpO2 98%   BMI 26.45 kg/m   Physical Exam Vitals and nursing note reviewed.  Constitutional:      Appearance: He is well-developed.  HENT:     Head: Atraumatic.     Mouth/Throat:     Comments: Patient has lost multiple teeth in the left lower quadrant. No gingival abscess, fluctuance.  No pulp exposure.  Tenderness around the left upper quadrant and left lower quadrant molar teeth. Cardiovascular:     Rate and Rhythm: Normal rate.  Pulmonary:     Effort: Pulmonary effort is normal.  Musculoskeletal:     Cervical back: Neck supple.  Skin:    General: Skin is warm.  Neurological:     Mental Status: He is alert and oriented to person, place, and time.     ED Results / Procedures /  Treatments   Labs (all labs ordered are listed, but only abnormal results are displayed) Labs Reviewed - No data to display  EKG None  Radiology No results found.  Procedures Procedures (including critical care time)  Medications Ordered in ED Medications - No data to display  ED Course  I have reviewed the triage vital signs and the nursing notes.  Pertinent labs & imaging results that were available during my care of the patient were reviewed by me and considered in my medical decision making (see chart for details).    MDM Rules/Calculators/A&P                          DDx includes: - Periapical tooth infection - Dental abscess - Gingivitis - Dental  trauma - Pulpitis - Nerve root compression  Patient comes in a chief complaint of toothache.  It appears any peri or dental infection.  Will prescribe him Augmentin.  It appears that he was given Clinda earlier.  Dental information also provided again.  Final Clinical Impression(s) / ED Diagnoses Final diagnoses:  Dental infection  Medication refill    Rx / DC Orders ED Discharge Orders         Ordered    amoxicillin-clavulanate (AUGMENTIN) 875-125 MG tablet  Every 12 hours        04/01/20 0836           Derwood Kaplan, MD 04/03/20 [phone]

## 2020-04-05 ENCOUNTER — Emergency Department (HOSPITAL_COMMUNITY)
Admission: EM | Admit: 2020-04-05 | Discharge: 2020-04-05 | Disposition: A | Discharge status: Home / Self Care | Payer: Self-pay | Attending: Emergency Medicine | Admitting: Emergency Medicine

## 2020-04-05 ENCOUNTER — Other Ambulatory Visit: Payer: Self-pay

## 2020-04-05 ENCOUNTER — Encounter (HOSPITAL_COMMUNITY): Payer: Self-pay | Admitting: *Deleted

## 2020-04-05 DIAGNOSIS — Z760 Encounter for issue of repeat prescription: Secondary | ICD-10-CM | POA: Insufficient documentation

## 2020-04-05 DIAGNOSIS — R220 Localized swelling, mass and lump, head: Secondary | ICD-10-CM | POA: Insufficient documentation

## 2020-04-05 MED ORDER — NAPROXEN 500 MG PO TABS: 500.0000 mg | ORAL_TABLET | Freq: Two times a day (BID) | ORAL | 0 refills | Status: AC

## 2020-04-05 MED ORDER — AMOXICILLIN-POT CLAVULANATE 875-125 MG PO TABS
1.0000 | ORAL_TABLET | Freq: Once | ORAL | Status: AC
  Administered 2020-04-05: 1 via ORAL
  Filled 2020-04-05: qty 1

## 2020-04-05 MED ORDER — NAPROXEN 500 MG PO TABS
500.0000 mg | ORAL_TABLET | Freq: Once | ORAL | Status: AC
  Administered 2020-04-05: 500 mg via ORAL
  Filled 2020-04-05: qty 1

## 2020-04-05 NOTE — ED Notes (Signed)
Graham crackers and juice given.

## 2020-04-05 NOTE — ED Provider Notes (Signed)
[facility]   CSN: [id number] Arrival date & time: 04/05/20  0932     History Chief Complaint  Patient presents with  . Medication Refill  . Facial Swelling    Marcus Jefferson is a 29 y.o. male who has been seen here in the ED multiple times for dental infection in the past 2 weeks.  He was first evaluated on 03/22/2020 and prescribed clindamycin.  He returned on 04/01/2020 and stated that his dog ate his antibiotics.  He was subsequently prescribed Augmentin and once again provided dental information.  On my examination, patient states that the clindamycin was more effective in managing his dental infection.  He states that he had been initially prescribed naproxen which also helped considerably with his inflammation and discomfort.  However, he states that he ran out of medication and has not been taking week.  He states that he has been taking his Augmentin, however continues to have intermittent swelling and pain.  He has a dental appointment scheduled for early January.  He denies any fevers, difficulty eating or drinking, difficulty swallowing, significant facial swelling, drooling, sore throat, or other symptoms.    HPI     Past Medical History:  Diagnosis Date  . Prostate enlargement     There are no problems to display for this patient.   History reviewed. No pertinent surgical history.     No family history on file.  Social History   Tobacco Use  . Smoking status: Never Smoker  . Smokeless tobacco: Never Used  Substance Use Topics  . Alcohol use: Yes    Comment: occasionally  . Drug use: No    Home Medications Prior to Admission medications   Medication Sig Start Date End Date Taking? Authorizing Provider  amoxicillin-clavulanate (AUGMENTIN) 875-125 MG tablet Take 1 tablet by mouth every 12 (twelve) hours. 04/01/20   Derwood Kaplan, MD  ibuprofen (ADVIL,MOTRIN) 800 MG tablet Take 1 tablet (800 mg total) by  mouth 3 (three) times daily. 05/12/14   Santiago Glad, PA-C  loratadine (CLARITIN) 10 MG tablet Take 1 tablet (10 mg total) by mouth daily. One po daily x 5 days 04/14/14   Ladona Mow, PA-C  methocarbamol (ROBAXIN) 500 MG tablet Take 1 tablet (500 mg total) by mouth 2 (two) times daily. 05/12/14   Santiago Glad, PA-C  naproxen (NAPROSYN) 500 MG tablet Take 1 tablet (500 mg total) by mouth 2 (two) times daily. 04/05/20   Lorelee New, PA-C    Allergies    Patient has no known allergies.  Review of Systems   Review of Systems  All other systems reviewed and are negative.   Physical Exam Updated Vital Signs BP 134/82 (BP Location: Left Arm)   Pulse 73   Temp 98.4 F (36.9 C) (Oral)   Ht 6' (1.829 m)   Wt 88.5 kg   SpO2 100%   BMI 26.45 kg/m   Physical Exam Vitals and nursing note reviewed. Exam conducted with a chaperone present.  Constitutional:      General: He is not in acute distress.    Appearance: Normal appearance. He is not ill-appearing.  HENT:     Head: Normocephalic and atraumatic.     Mouth/Throat:     Comments: Poor dentition noted throughout.  Multiple caries in history of extractions.  Patent oropharynx.  No trismus.  No floor of mouth swelling.  No asymmetries noted.  No masses or areas of fluctuance.  No drooling. Eyes:  General: No scleral icterus.    Conjunctiva/sclera: Conjunctivae normal.  Cardiovascular:     Rate and Rhythm: Normal rate.     Pulses: Normal pulses.  Pulmonary:     Effort: Pulmonary effort is normal. No respiratory distress.  Musculoskeletal:     Cervical back: Normal range of motion. No rigidity.  Skin:    General: Skin is dry.     Capillary Refill: Capillary refill takes less than 2 seconds.  Neurological:     Mental Status: He is alert and oriented to person, place, and time.     GCS: GCS eye subscore is 4. GCS verbal subscore is 5. GCS motor subscore is 6.  Psychiatric:        Mood and Affect: Mood normal.         Behavior: Behavior normal.        Thought Content: Thought content normal.     ED Results / Procedures / Treatments   Labs (all labs ordered are listed, but only abnormal results are displayed) Labs Reviewed - No data to display  EKG None  Radiology No results found.  Procedures Procedures (including critical care time)  Medications Ordered in ED Medications  amoxicillin-clavulanate (AUGMENTIN) 875-125 MG per tablet 1 tablet (has no administration in time range)  naproxen (NAPROSYN) tablet 500 mg (has no administration in time range)    ED Course  I have reviewed the triage vital signs and the nursing notes.  Pertinent labs & imaging results that were available during my care of the patient were reviewed by me and considered in my medical decision making (see chart for details).    MDM Rules/Calculators/A&P                          Patient still has many Augmentin pills remaining because he states that he stopped taking them yesterday given the lack of clinical improvement.  However, he also admits that he has not been taking naproxen ever since his most recent ED encounter.  We will provide him with a dose of Augmentin here in the ED, encourage him to finish his course of antibiotics as directed, and discharge him home with continued naproxen outpatient.  There is nothing particularly concerning on my physical exam.  While he would prefer to go back on clindamycin, feels that Augmentin is an appropriate antibiotic.    ED return precautions discussed.  Will provide him with resources.  Encouraged him to call and try to move up his appointment, if possible.  Patient voices understanding and is agreeable to the plan.  Final Clinical Impression(s) / ED Diagnoses Final diagnoses:  Medication refill    Rx / DC Orders ED Discharge Orders         Ordered    naproxen (NAPROSYN) 500 MG tablet  2 times daily        04/05/20 1050           Elvera Maria 04/05/20  1050    Cathren Laine, MD 04/05/20 1347

## 2020-04-05 NOTE — Discharge Instructions (Signed)
Please read the attachment on local dental resources.    Your exam here today is reassuring.  However, you do need close follow-up and dental intervention.  Please call your dentist to see if they can expedite your appointment.  Continue take your Augmentin, as directed.  I would also like you to take naproxen 500 mg twice daily for any discomfort and swelling.  Return to the ED or seek immediate medical attention should you experience any new or worsening symptoms.

## 2020-04-05 NOTE — ED Triage Notes (Addendum)
Pt seen for toothache with abscess last week. He was given Clindamycin and pills were not taken due to dog chewing them up, come back and was started on Amoxicillin and Clavulante which is not working to reduce swelling.  Pt states his dental appointment is in Jan.

## 2021-07-14 ENCOUNTER — Other Ambulatory Visit: Payer: Self-pay

## 2021-07-14 ENCOUNTER — Ambulatory Visit: Payer: BLUE CROSS/BLUE SHIELD | Admitting: Allergy

## 2021-08-04 ENCOUNTER — Encounter: Payer: Self-pay | Admitting: Physician Assistant

## 2022-07-08 ENCOUNTER — Emergency Department (HOSPITAL_COMMUNITY)
Admission: EM | Admit: 2022-07-08 | Discharge: 2022-07-08 | Disposition: A | Discharge status: Home / Self Care | Payer: Self-pay | Attending: Emergency Medicine | Admitting: Emergency Medicine

## 2022-07-08 ENCOUNTER — Other Ambulatory Visit: Payer: Self-pay

## 2022-07-08 DIAGNOSIS — K029 Dental caries, unspecified: Secondary | ICD-10-CM | POA: Insufficient documentation

## 2022-07-08 MED ORDER — IBUPROFEN 800 MG PO TABS
800.0000 mg | ORAL_TABLET | Freq: Once | ORAL | Status: AC
  Administered 2022-07-08: 800 mg via ORAL
  Filled 2022-07-08: qty 1

## 2022-07-08 MED ORDER — PENICILLIN V POTASSIUM 500 MG PO TABS
500.0000 mg | ORAL_TABLET | Freq: Four times a day (QID) | ORAL | 0 refills | Status: AC
Start: 2022-07-08 — End: 2022-07-15

## 2022-07-08 MED ORDER — DEXAMETHASONE SODIUM PHOSPHATE 10 MG/ML IJ SOLN
10.0000 mg | Freq: Once | INTRAMUSCULAR | Status: AC
  Administered 2022-07-08: 10 mg via INTRAMUSCULAR
  Filled 2022-07-08: qty 1

## 2022-07-08 MED ORDER — PENICILLIN V POTASSIUM 500 MG PO TABS
500.0000 mg | ORAL_TABLET | Freq: Four times a day (QID) | ORAL | 0 refills | Status: DC
Start: 2022-07-08 — End: 2022-07-08

## 2022-07-08 NOTE — ED Provider Notes (Signed)
[place] EMERGENCY DEPARTMENT AT [facility] Provider Note   CSN: [id number] Arrival date & time: 07/08/22  0050     History  Chief Complaint  Patient presents with   Dental Pain    Marcus Jefferson is a 32 y.o. male.   Dental Pain    Patient presents due to dental pain.  This has been going on off and on for the last year, worse in the last week.  No facial swelling, fevers although he has been having chills.  Denies any facial trauma, no difficulty swallowing, recent antibiotic use..Does not currently have dentist.   Home Medications Prior to Admission medications   Medication Sig Start Date End Date Taking? Authorizing Provider  ibuprofen (ADVIL,MOTRIN) 800 MG tablet Take 1 tablet (800 mg total) by mouth 3 (three) times daily. 05/12/14   Hyman Bible, PA-C  loratadine (CLARITIN) 10 MG tablet Take 1 tablet (10 mg total) by mouth daily. One po daily x 5 days 04/14/14   Dahlia Bailiff, PA-C  methocarbamol (ROBAXIN) 500 MG tablet Take 1 tablet (500 mg total) by mouth 2 (two) times daily. 05/12/14   Hyman Bible, PA-C  naproxen (NAPROSYN) 500 MG tablet Take 1 tablet (500 mg total) by mouth 2 (two) times daily. 04/05/20   Corena Herter, PA-C  penicillin v potassium (VEETID) 500 MG tablet Take 1 tablet (500 mg total) by mouth 4 (four) times daily for 7 days. 07/08/22 07/15/22  Sherrill Raring, PA-C      Allergies    Patient has no known allergies.    Review of Systems   Review of Systems  Physical Exam Updated Vital Signs BP 127/79   Pulse 60   Temp 98.3 F (36.8 C) (Oral)   Resp 16   SpO2 94%  Physical Exam Vitals and nursing note reviewed. Exam conducted with a chaperone present.  Constitutional:      General: He is not in acute distress.    Appearance: Normal appearance.  HENT:     Head: Normocephalic and atraumatic.     Mouth/Throat:     Comments: Dental decay right upper premolar.  No obvious abscess, uvula is midline.  No sublingual tenderness  or swelling. Eyes:     General: No scleral icterus.    Extraocular Movements: Extraocular movements intact.     Pupils: Pupils are equal, round, and reactive to light.  Skin:    Coloration: Skin is not jaundiced.  Neurological:     Mental Status: He is alert. Mental status is at baseline.     Coordination: Coordination normal.     Comments: No facial droop     ED Results / Procedures / Treatments   Labs (all labs ordered are listed, but only abnormal results are displayed) Labs Reviewed - No data to display  EKG None  Radiology No results found.  Procedures Procedures    Medications Ordered in ED Medications  dexamethasone (DECADRON) injection 10 mg (10 mg Intramuscular Given 07/08/22 0151)  ibuprofen (ADVIL) tablet 800 mg (800 mg Oral Given 07/08/22 0218)    ED Course/ Medical Decision Making/ A&P                             Medical Decision Making Risk Prescription drug management.   Patient presents due to dental pain.  On exam he does have dental decay on exam.  Clinically no SIRS criteria and is not septic.  Based on exam  low suspicion for peritonsillar, retropharyngeal abscess, Ludwig angina.  Possible developing abscess given chills and worsening dental decay, will cover antibiotics, Decadron for inflammation and have him follow-up with dentist.  Resources provided as well as [facility] dental school.  Return precautions discussed, stable for outpatient follow-up.        Final Clinical Impression(s) / ED Diagnoses Final diagnoses:  Dental decay    Rx / DC Orders ED Discharge Orders          Ordered    penicillin v potassium (VEETID) 500 MG tablet  4 times daily,   Status:  Discontinued        07/08/22 0131    penicillin v potassium (VEETID) 500 MG tablet  4 times daily        07/08/22 0211              Sherrill Raring, PA-C 07/08/22 0610    Fatima Blank, MD 07/08/22 [phone]

## 2022-07-08 NOTE — ED Triage Notes (Signed)
Pt c/o right upper dental pain. No swelling. Has chipped tooth. Has been unable to schedule removal.

## 2022-07-08 NOTE — Discharge Instructions (Addendum)
The Decadron given each in the ED will help with inflammation, this will stay in her system and work over the next 2 days.  Take 600 mg of ibuprofen every 6 hours.  Take with food and water.  You can also take 1000 mg of Tylenol every 8 hours, it is okay to take both.  Call and schedule follow-up with [facility] dental school, take the veetid as prescribed.  Return to the ED for new or concerning symptoms.

## 2023-09-06 ENCOUNTER — Emergency Department (HOSPITAL_COMMUNITY)
Admission: EM | Admit: 2023-09-06 | Discharge: 2023-09-06 | Disposition: A | Discharge status: Home / Self Care | Payer: Self-pay | Attending: Emergency Medicine | Admitting: Emergency Medicine

## 2023-09-06 ENCOUNTER — Other Ambulatory Visit: Payer: Self-pay

## 2023-09-06 ENCOUNTER — Encounter (HOSPITAL_COMMUNITY): Payer: Self-pay

## 2023-09-06 DIAGNOSIS — K0889 Other specified disorders of teeth and supporting structures: Secondary | ICD-10-CM | POA: Insufficient documentation

## 2023-09-06 DIAGNOSIS — K0381 Cracked tooth: Secondary | ICD-10-CM | POA: Insufficient documentation

## 2023-09-06 MED ORDER — IBUPROFEN 800 MG PO TABS
800.0000 mg | ORAL_TABLET | Freq: Once | ORAL | Status: AC
  Administered 2023-09-06: 800 mg via ORAL
  Filled 2023-09-06: qty 1

## 2023-09-06 MED ORDER — PENICILLIN V POTASSIUM 500 MG PO TABS
500.0000 mg | ORAL_TABLET | Freq: Once | ORAL | Status: AC
Start: 2023-09-06 — End: 2023-09-06
  Administered 2023-09-06: 500 mg via ORAL
  Filled 2023-09-06: qty 1

## 2023-09-06 MED ORDER — IBUPROFEN 800 MG PO TABS: 800.0000 mg | ORAL_TABLET | Freq: Three times a day (TID) | ORAL | 0 refills | Status: AC | PRN

## 2023-09-06 MED ORDER — CHLORHEXIDINE GLUCONATE 0.12 % MT SOLN: 15.0000 mL | Freq: Two times a day (BID) | OROMUCOSAL | 0 refills | Status: AC

## 2023-09-06 MED ORDER — PENICILLIN V POTASSIUM 500 MG PO TABS: 500.0000 mg | ORAL_TABLET | Freq: Four times a day (QID) | ORAL | 0 refills | Status: AC

## 2023-09-06 NOTE — ED Provider Notes (Signed)
 [place] EMERGENCY DEPARTMENT AT [facility] Provider Note   CSN: [id number] Arrival date & time: 09/06/23  4098     History  Chief Complaint  Patient presents with   Dental Pain    Marcus Jefferson is a 33 y.o. male presenting to ED with dental pain.  Patient has had problems with broken teeth and dental pain for some years.  He reports he has had a few days of worsening pain in the left upper side of his mouth and his left lower face.  No reported fevers or chills.  HPI     Home Medications Prior to Admission medications   Medication Sig Start Date End Date Taking? Authorizing Provider  chlorhexidine (PERIDEX) 0.12 % solution Use as directed 15 mLs in the mouth or throat 2 (two) times daily for 10 days. 09/06/23 09/16/23 Yes Kloe Oates, Janalyn Me, MD  ibuprofen  (ADVIL ) 800 MG tablet Take 1 tablet (800 mg total) by mouth every 8 (eight) hours as needed for up to 30 doses. 09/06/23  Yes Tyke Outman, Janalyn Me, MD  penicillin  v potassium (VEETID) 500 MG tablet Take 1 tablet (500 mg total) by mouth 4 (four) times daily for 7 days. 09/06/23 09/13/23 Yes Laveta Gilkey, Janalyn Me, MD  ibuprofen  (ADVIL ,MOTRIN ) 800 MG tablet Take 1 tablet (800 mg total) by mouth 3 (three) times daily. 05/12/14   Letcher Rattler, PA-C  loratadine  (CLARITIN ) 10 MG tablet Take 1 tablet (10 mg total) by mouth daily. One po daily x 5 days 04/14/14   Alisa Irish, PA-C  methocarbamol  (ROBAXIN ) 500 MG tablet Take 1 tablet (500 mg total) by mouth 2 (two) times daily. 05/12/14   Letcher Rattler, PA-C  naproxen  (NAPROSYN ) 500 MG tablet Take 1 tablet (500 mg total) by mouth 2 (two) times daily. 04/05/20   Jhonnie Mosher, PA-C      Allergies    Patient has no known allergies.    Review of Systems   Review of Systems  Physical Exam Updated Vital Signs BP 134/67 (BP Location: Right Arm)   Pulse (!) 57   Temp 98 F (36.7 C) (Oral)   Resp 18   SpO2 100%  Physical Exam Constitutional:      General: He is not in  acute distress. HENT:     Head: Normocephalic and atraumatic.     Comments: Multiple fractured teeth in bilateral upper molars; no evident swelling or periapical abscess or purulent drainage, no elevation of the tongue or muffled voice No visible swelling of the face Eyes:     Conjunctiva/sclera: Conjunctivae normal.     Pupils: Pupils are equal, round, and reactive to light.  Cardiovascular:     Rate and Rhythm: Normal rate and regular rhythm.  Pulmonary:     Effort: Pulmonary effort is normal. No respiratory distress.  Skin:    General: Skin is warm and dry.  Neurological:     General: No focal deficit present.     Mental Status: He is alert. Mental status is at baseline.  Psychiatric:        Mood and Affect: Mood normal.        Behavior: Behavior normal.     ED Results / Procedures / Treatments   Labs (all labs ordered are listed, but only abnormal results are displayed) Labs Reviewed - No data to display  EKG None  Radiology No results found.  Procedures Procedures    Medications Ordered in ED Medications  penicillin  v potassium (VEETID) tablet 500  mg (has no administration in time range)  ibuprofen  (ADVIL ) tablet 800 mg (has no administration in time range)    ED Course/ Medical Decision Making/ A&P                                 Medical Decision Making Risk Prescription drug management.   Patient presenting with concern for dental pain.  Has multiple fractured teeth that need the attention of a dentist or oral surgeon.  He has a difficulty affording to do this, but has asked for resources to nearby dental clinics.  We also discussed option of searching for dental schools nearby which offer discounted treatment.  I will start him on penicillin  for now and give ibuprofen  for pain.  I do not see evidence of a lot of exemption or deep space neck or face infection.  He is not requiring a CT scan at this time.  He is stable for discharge        Final  Clinical Impression(s) / ED Diagnoses Final diagnoses:  Pain, dental    Rx / DC Orders ED Discharge Orders          Ordered    ibuprofen  (ADVIL ) 800 MG tablet  Every 8 hours PRN        09/06/23 0815    penicillin  v potassium (VEETID) 500 MG tablet  4 times daily        09/06/23 0815    chlorhexidine (PERIDEX) 0.12 % solution  2 times daily        09/06/23 0815              Arvilla Birmingham, MD 09/06/23 [phone]

## 2023-09-06 NOTE — ED Triage Notes (Signed)
 Pt arrives via POV. Pt reports left upper dental pain for the past 2 days. Pt AxOx4.
